# Patient Record
Sex: Male | Born: 1990 | Race: White | Hispanic: No | Marital: Married | Smoking: Never smoker
Health system: Southern US, Community
[De-identification: ages and names within clinical notes are randomized; demographics above are authoritative.]

## PROBLEM LIST (undated history)

## (undated) DIAGNOSIS — K219 Gastro-esophageal reflux disease without esophagitis: Secondary | ICD-10-CM

## (undated) DIAGNOSIS — R569 Unspecified convulsions: Secondary | ICD-10-CM

## (undated) HISTORY — PX: NO PAST SURGERIES: SHX2092

---
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
## 2014-10-07 LAB — BASIC METABOLIC PANEL
BUN: 19 mg/dL (ref 4–21)
Creatinine: 1 mg/dL (ref <=1.3)
GLUCOSE: 95 mg/dL
Potassium: 4.2 mmol/L (ref 3.4–5.3)
SODIUM: 140 mmol/L (ref 137–147)

## 2014-10-07 LAB — HEPATIC FUNCTION PANEL
AST: 65 U/L — AB (ref 14–40)
Alkaline Phosphatase: 84 U/L (ref 25–125)
BILIRUBIN, TOTAL: 0.5 mg/dL

## 2014-10-17 DIAGNOSIS — Z8669 Personal history of other diseases of the nervous system and sense organs: Secondary | ICD-10-CM | POA: Insufficient documentation

## 2014-10-17 DIAGNOSIS — F988 Other specified behavioral and emotional disorders with onset usually occurring in childhood and adolescence: Secondary | ICD-10-CM | POA: Insufficient documentation

## 2014-10-17 DIAGNOSIS — R748 Abnormal levels of other serum enzymes: Secondary | ICD-10-CM | POA: Insufficient documentation

## 2014-12-10 ENCOUNTER — Ambulatory Visit: Payer: Self-pay | Admitting: Family Medicine

## 2015-01-14 ENCOUNTER — Other Ambulatory Visit: Payer: Self-pay | Admitting: Gastroenterology

## 2015-01-14 DIAGNOSIS — R748 Abnormal levels of other serum enzymes: Secondary | ICD-10-CM

## 2015-01-15 ENCOUNTER — Ambulatory Visit
Admission: RE | Admit: 2015-01-15 | Discharge: 2015-01-15 | Disposition: A | Discharge status: Home / Self Care | Payer: BLUE CROSS/BLUE SHIELD | Source: Ambulatory Visit | Attending: Gastroenterology | Admitting: Gastroenterology

## 2015-01-15 DIAGNOSIS — R748 Abnormal levels of other serum enzymes: Secondary | ICD-10-CM | POA: Diagnosis not present

## 2015-01-21 ENCOUNTER — Encounter: Payer: Self-pay | Admitting: Family Medicine

## 2015-01-21 ENCOUNTER — Ambulatory Visit (INDEPENDENT_AMBULATORY_CARE_PROVIDER_SITE_OTHER): Payer: BLUE CROSS/BLUE SHIELD | Admitting: Family Medicine

## 2015-01-21 VITALS — BP 146/72 | HR 60 | Temp 98.1°F | Resp 12 | Wt 163.0 lb

## 2015-01-21 DIAGNOSIS — F988 Other specified behavioral and emotional disorders with onset usually occurring in childhood and adolescence: Secondary | ICD-10-CM

## 2015-01-21 DIAGNOSIS — K219 Gastro-esophageal reflux disease without esophagitis: Secondary | ICD-10-CM | POA: Insufficient documentation

## 2015-01-21 DIAGNOSIS — F909 Attention-deficit hyperactivity disorder, unspecified type: Secondary | ICD-10-CM

## 2015-01-21 MED ORDER — METHYLPHENIDATE HCL ER (OSM) 36 MG PO TBCR: 36.0000 mg | EXTENDED_RELEASE_TABLET | Freq: Two times a day (BID) | ORAL | Status: DC | PRN

## 2015-01-21 NOTE — Progress Notes (Signed)
Patient ID: Clinton Bishop, male   DOB: 19-Mar-1991, 24 y.o.   MRN: [id number]    Subjective:  HPI  ADHD follow up: Patient was last seen in May 2016. He is doing well, symptoms are stable. He only takes Concerta maybe once a week if even that and is doing fine with that regimen.  Migraine headaches follow up: He is doing fine with this. He has Imitrex and may have to take it about once a month.  Elevated liver enzymes follow up: In may his AST and ALT was still up so we sent him to see Dr. Shelle Iron and he saw him, he had more labs drawn and had CT scan done. Dr. Shelle Iron is not sure why his enzymes are still up and recommended to get liver biopsy and patient wanted to disscuss this today.  B/P elevated a little today from his normal and patient states this was an issue when he saw Dr. Shelle Iron also.  Prior to Admission medications   Medication Sig Start Date End Date Taking? Authorizing Provider  methylphenidate 36 MG PO CR tablet Take by mouth. 09/29/14   Historical Provider, MD  SUMAtriptan (IMITREX) 50 MG tablet Take by mouth. 10/08/13   Historical Provider, MD    Patient Active Problem List   Diagnosis Date Noted  . Acid reflux 01/21/2015  . ADD (attention deficit disorder) 10/17/2014  . Abnormal liver enzymes 10/17/2014  . History of migraine headaches 10/17/2014    No past medical history on file.  Social History   Social History  . Marital Status: Single    Spouse Name: N/A  . Number of Children: N/A  . Years of Education: N/A   Occupational History  . Not on file.   Social History Main Topics  . Smoking status: Never Smoker   . Smokeless tobacco: Never Used  . Alcohol Use: No  . Drug Use: No  . Sexual Activity: Not on file   Other Topics Concern  . Not on file   Social History Narrative    No Known Allergies  Review of Systems  Constitutional: Negative.   Respiratory: Negative.   Cardiovascular: Negative.   Gastrointestinal: Negative.   Musculoskeletal:  Negative.   Neurological: Negative.      There is no immunization history on file for this patient. Objective:  BP 146/72 mmHg  Pulse 60  Temp(Src) 98.1 F (36.7 C)  Resp 12  Wt 163 lb (73.936 kg)  Physical Exam  Constitutional: He is well-developed, well-nourished, and in no distress.  HENT:  Head: Normocephalic and atraumatic.  Right Ear: External ear normal.  Left Ear: External ear normal.  Nose: Nose normal.  Eyes: Conjunctivae are normal.  Cardiovascular: Normal rate.   Pulmonary/Chest: Effort normal.  Abdominal: Soft.  Skin: Skin is warm and dry.  Psychiatric: Mood, memory, affect and judgment normal.    No results found for: WBC, HGB, HCT, PLT, GLUCOSE, CHOL, TRIG, HDL, LDLDIRECT, LDLCALC, TSH, PSA, INR, GLUF, HGBA1C, MICROALBUR  CMP     Component Value Date/Time   NA 140 10/07/2014   K 4.2 10/07/2014   BUN 19 10/07/2014   CREATININE 1.0 10/07/2014   AST 65* 10/07/2014   ALKPHOS 84 10/07/2014    Assessment and Plan :  1. ADD (attention deficit disorder) Refill 1 - methylphenidate 36 MG PO CR tablet; Take 1 tablet (36 mg total) by mouth 2 (two) times daily as needed.  Dispense: 60 tablet; Refill: 0 2. Elevated liver enzymes Unknown etiology. Workup by  Dr.Rein was negative. He recommended a liver biopsy or referral to tertiary center. Reviewing everything and long discussion with patient will recommend referral to [facility] or [facility]. Patient is a nondrinker who uses no illicit drugs. GI did not think it was a Concerta as a cause. 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 Watch blood pressure I have done the exam and reviewed the above chart and it is accurate to the best of my knowledge.  Julieanne Manson MD [facility] 01/21/2015 1:59 PM

## 2015-01-22 ENCOUNTER — Other Ambulatory Visit: Payer: Self-pay | Admitting: Orthopedic Surgery

## 2015-01-22 DIAGNOSIS — R748 Abnormal levels of other serum enzymes: Secondary | ICD-10-CM

## 2015-01-29 ENCOUNTER — Other Ambulatory Visit: Payer: Self-pay | Admitting: Radiology

## 2015-02-02 ENCOUNTER — Ambulatory Visit
Admission: RE | Admit: 2015-02-02 | Discharge: 2015-02-02 | Disposition: A | Discharge status: Home / Self Care | Payer: BLUE CROSS/BLUE SHIELD | Source: Ambulatory Visit | Attending: Orthopedic Surgery | Admitting: Orthopedic Surgery

## 2015-02-02 DIAGNOSIS — R748 Abnormal levels of other serum enzymes: Secondary | ICD-10-CM | POA: Diagnosis present

## 2015-02-02 DIAGNOSIS — K219 Gastro-esophageal reflux disease without esophagitis: Secondary | ICD-10-CM | POA: Insufficient documentation

## 2015-02-02 DIAGNOSIS — K760 Fatty (change of) liver, not elsewhere classified: Secondary | ICD-10-CM | POA: Insufficient documentation

## 2015-02-02 HISTORY — DX: Unspecified convulsions: R56.9

## 2015-02-02 HISTORY — DX: Gastro-esophageal reflux disease without esophagitis: K21.9

## 2015-02-02 LAB — APTT: APTT: 30 s (ref 24–36)

## 2015-02-02 LAB — CBC
HCT: 45.4 % (ref 40.0–52.0)
HEMOGLOBIN: 15.6 g/dL (ref 13.0–18.0)
MCH: 30.5 pg (ref 26.0–34.0)
MCHC: 34.3 g/dL (ref 32.0–36.0)
MCV: 88.8 fL (ref 80.0–100.0)
PLATELETS: 311 10*3/uL (ref 150–440)
RBC: 5.11 MIL/uL (ref 4.40–5.90)
RDW: 12.9 % (ref 11.5–14.5)
WBC: 8.4 10*3/uL (ref 3.8–10.6)

## 2015-02-02 LAB — PROTIME-INR
INR: 0.91
PROTHROMBIN TIME: 12.5 s (ref 11.4–15.0)

## 2015-02-02 MED ORDER — SODIUM CHLORIDE 0.9 % IV SOLN
Freq: Once | INTRAVENOUS | Status: AC
  Administered 2015-02-02: 09:00:00 via INTRAVENOUS

## 2015-02-02 MED ORDER — MIDAZOLAM HCL 5 MG/5ML IJ SOLN
INTRAMUSCULAR | Status: AC | PRN
  Administered 2015-02-02 (×2): 1 mg via INTRAVENOUS

## 2015-02-02 MED ORDER — FENTANYL CITRATE (PF) 100 MCG/2ML IJ SOLN
INTRAMUSCULAR | Status: AC | PRN
  Administered 2015-02-02: 50 ug via INTRAVENOUS
  Administered 2015-02-02: 25 ug via INTRAVENOUS

## 2015-02-02 NOTE — Procedures (Signed)
Interventional Radiology Procedure Note  Procedure: US guided medical liver biopsy, right liver lobe.  3 x 18G core biopsy.   Complications: None Recommendations:  - Ok to shower tomorrow - Do not submerge for 7 days - Routine care  - follow up biopsy result - return to work with light duties for 48 hours.   Signed,  Yvone Neu. Loreta Ave, DO

## 2015-02-02 NOTE — H&P (Signed)
Chief Complaint: I'm here for a liver biopsy.   Referring Physician(s): Weingold,Matthew  History of Present Illness: Clinton Bishop is a 24 y.o. male referred for evaluation for a medical liver biopsy.    He has a history of transaminitis, with no cause identified.    Past Medical History  Diagnosis Date  . GERD (gastroesophageal reflux disease)   . Seizures     Past Surgical History  Procedure Laterality Date  . No past surgeries      Allergies: Review of patient's allergies indicates no known allergies.  Medications: Prior to Admission medications   Medication Sig Start Date End Date Taking? Authorizing Provider  ibuprofen (ADVIL,MOTRIN) 100 MG tablet Take 100 mg by mouth every 6 (six) hours as needed for fever.   Yes Historical Provider, MD  methylphenidate 36 MG PO CR tablet Take 1 tablet (36 mg total) by mouth 2 (two) times daily as needed. 01/21/15  Yes Richard Hulen Shouts., MD  SUMAtriptan (IMITREX) 50 MG tablet Take by mouth. 10/08/13  Yes Historical Provider, MD     Family History  Problem Relation Age of Onset  . Anxiety disorder Sister     Social History   Social History  . Marital Status: Single    Spouse Name: N/A  . Number of Children: N/A  . Years of Education: N/A   Social History Main Topics  . Smoking status: Never Smoker   . Smokeless tobacco: Never Used  . Alcohol Use: No  . Drug Use: No  . Sexual Activity: Not Asked   Other Topics Concern  . None   Social History Narrative       Review of Systems: A 12 point ROS discussed and pertinent positives are indicated in the HPI above.  All other systems are negative.  Review of Systems  Vital Signs: BP 128/83 mmHg  Pulse 65  Temp(Src) 98.2 F (36.8 C) (Oral)  Resp 13  Ht  (1.676 m)  Wt 160 lb (72.576 kg)  BMI 25.84 kg/m2  SpO2 99%  Physical Exam  Mallampati Score:  2 Imaging: [place] Art/ven Flow Abd Pelv Doppler  01/15/2015   CLINICAL DATA:  Elevated liver  enzymes  EXAM: DUPLEX ULTRASOUND OF LIVER ; ULTRASOUND RIGHT UPPER QUADRANT  TECHNIQUE: Color and duplex Doppler ultrasound was performed to evaluate the hepatic in-flow and out-flow vessels. Real-time interrogation of the gallbladder, common bile duct, and liver obtained.  COMPARISON:  None.  FINDINGS: Gallbladder: There are no gallstones. No gallbladder wall thickening or pericholecystic fluid. There is no sonographic Murphy sign.  Common bile duct: 2 mm. No intrahepatic or extrahepatic biliary duct dilatation.  Liver: Liver echogenicity is normal. No focal liver lesions are identified morphologically.  Portal Vein Velocities  Main: 40.6 centimeter/second proximal; 27.8 centimeter/second mid ; 28.4 cm per second distally  Right:  21.3 cm/sec  Left:  31.5 cm/sec  Hepatic Vein Velocities  Right:  30.0 cm/sec  Middle:  29.0 cm/sec  Left:  21.3 cm/sec  Hepatic Artery Velocity:  68.4 cm/sec  Splenic Vein Velocity:  21.7 cm/sec  Infrahepatic inferior vena cava:  20.3 centimeter/second.  Varices:  None  Ascites: None  Flow in all vessels is in the anatomic direction. There is no demonstrable portal vein thrombus or occlusion. There is no splenic vein thrombus or occlusion. Spleen is normal in size and contour.  IMPRESSION: There is no lesion referable to the gallbladder, biliary ductal system, or liver. The portal and hepatic veins are patent  with flow in the respective anatomic direction. Peak systolic velocities are within normal limits. No abnormality is identified on this study.   Electronically Signed   By: Bretta Bang III M.D.   On: 01/15/2015 11:03   US Abdomen Limited Ruq  01/15/2015   CLINICAL DATA:  Elevated liver enzymes  EXAM: DUPLEX ULTRASOUND OF LIVER ; ULTRASOUND RIGHT UPPER QUADRANT  TECHNIQUE: Color and duplex Doppler ultrasound was performed to evaluate the hepatic in-flow and out-flow vessels. Real-time interrogation of the gallbladder, common bile duct, and liver obtained.  COMPARISON:  None.   FINDINGS: Gallbladder: There are no gallstones. No gallbladder wall thickening or pericholecystic fluid. There is no sonographic Murphy sign.  Common bile duct: 2 mm. No intrahepatic or extrahepatic biliary duct dilatation.  Liver: Liver echogenicity is normal. No focal liver lesions are identified morphologically.  Portal Vein Velocities  Main: 40.6 centimeter/second proximal; 27.8 centimeter/second mid ; 28.4 cm per second distally  Right:  21.3 cm/sec  Left:  31.5 cm/sec  Hepatic Vein Velocities  Right:  30.0 cm/sec  Middle:  29.0 cm/sec  Left:  21.3 cm/sec  Hepatic Artery Velocity:  68.4 cm/sec  Splenic Vein Velocity:  21.7 cm/sec  Infrahepatic inferior vena cava:  20.3 centimeter/second.  Varices:  None  Ascites: None  Flow in all vessels is in the anatomic direction. There is no demonstrable portal vein thrombus or occlusion. There is no splenic vein thrombus or occlusion. Spleen is normal in size and contour.  IMPRESSION: There is no lesion referable to the gallbladder, biliary ductal system, or liver. The portal and hepatic veins are patent with flow in the respective anatomic direction. Peak systolic velocities are within normal limits. No abnormality is identified on this study.   Electronically Signed   By: Bretta Bang III M.D.   On: 01/15/2015 11:03    Labs:  CBC:  Recent Labs  02/02/15 0835  WBC 8.4  HGB 15.6  HCT 45.4  PLT 311    COAGS:  Recent Labs  02/02/15 0835  INR 0.91  APTT 30    BMP:  Recent Labs  10/07/14  NA 140  K 4.2  BUN 19  CREATININE 1.0    LIVER FUNCTION TESTS:  Recent Labs  10/07/14  AST 65*  ALKPHOS 84    TUMOR MARKERS: No results for input(s): AFPTM, CEA, CA199, CHROMGRNA in the last 8760 hours.  Assessment and Plan:  Mr Clinton Bishop is a 24 year old male with transaminitis, meeting criteria for image guided liver biopsy.  US guided biopsy will be performed.   Thank you for this interesting consult.  I greatly enjoyed meeting  Clinton Bishop and look forward to participating in their care.  A copy of this report was sent to the requesting provider on this date.  SignedGilmer Mor 02/02/2015, 10:15 AM

## 2015-02-03 LAB — SURGICAL PATHOLOGY

## 2015-02-11 ENCOUNTER — Ambulatory Visit: Payer: BLUE CROSS/BLUE SHIELD | Admitting: Family Medicine

## 2015-06-14 ENCOUNTER — Other Ambulatory Visit: Payer: Self-pay

## 2015-11-08 ENCOUNTER — Ambulatory Visit (INDEPENDENT_AMBULATORY_CARE_PROVIDER_SITE_OTHER): Payer: 59 | Admitting: Family Medicine

## 2015-11-08 ENCOUNTER — Other Ambulatory Visit: Payer: Self-pay | Admitting: Family Medicine

## 2015-11-08 VITALS — BP 134/92 | HR 76 | Temp 98.6°F | Resp 12 | Wt 166.0 lb

## 2015-11-08 DIAGNOSIS — Z113 Encounter for screening for infections with a predominantly sexual mode of transmission: Secondary | ICD-10-CM

## 2015-11-08 NOTE — Progress Notes (Signed)
Patient ID: Clinton Bishop, male   DOB: 1990/09/24, 24 y.o.   MRN: [id number]    Subjective:  HPI  Patient would like to get checked for STD. He has not been exposed to STD as far as he knows. He is not having any symptoms. His girlfriend wanted to get tested to make sure they are both in the clear before becoming sexually active. No drug use, no IV drug use, no sex with prostitutes, no homosexual sex, he has had a low number  partners and none with STDs to his knowledge. Prior to Admission medications   Medication Sig Start Date End Date Taking? Authorizing Provider  ibuprofen (ADVIL,MOTRIN) 100 MG tablet Take 100 mg by mouth every 6 (six) hours as needed for fever.   Yes Historical Provider, MD  methylphenidate 36 MG PO CR tablet Take 1 tablet (36 mg total) by mouth 2 (two) times daily as needed. 01/21/15  Yes Richard Hulen Shouts., MD  SUMAtriptan (IMITREX) 50 MG tablet Take by mouth. 10/08/13  Yes Historical Provider, MD    Patient Active Problem List   Diagnosis Date Noted  . Acid reflux 01/21/2015  . ADD (attention deficit disorder) 10/17/2014  . Abnormal liver enzymes 10/17/2014  . History of migraine headaches 10/17/2014    Past Medical History  Diagnosis Date  . GERD (gastroesophageal reflux disease)   . Seizures     Social History   Social History  . Marital Status: Single    Spouse Name: N/A  . Number of Children: N/A  . Years of Education: N/A   Occupational History  . Not on file.   Social History Main Topics  . Smoking status: Never Smoker   . Smokeless tobacco: Never Used  . Alcohol Use: No  . Drug Use: No  . Sexual Activity: Not on file   Other Topics Concern  . Not on file   Social History Narrative    No Known Allergies  Review of Systems  Constitutional: Negative.   Respiratory: Negative.   Cardiovascular: Negative.   Gastrointestinal: Negative.   Genitourinary: Negative.   Musculoskeletal: Negative.      There is no immunization  history on file for this patient. Objective:  BP 134/92 mmHg  Pulse 76  Temp(Src) 98.6 F (37 C)  Resp 12  Wt 166 lb (75.297 kg)  Physical Exam  Constitutional: He is oriented to person, place, and time and well-developed, well-nourished, and in no distress.  HENT:  Head: Normocephalic and atraumatic.  Cardiovascular: Normal rate and regular rhythm.   Pulmonary/Chest: Effort normal and breath sounds normal.  Abdominal: Soft.  Genitourinary: Penis normal. No discharge found.  Neurological: He is alert and oriented to person, place, and time.  Skin: Skin is warm and dry.  Psychiatric: Mood, memory, affect and judgment normal.    Lab Results  Component Value Date   WBC 8.4 02/02/2015   HGB 15.6 02/02/2015   HCT 45.4 02/02/2015   PLT 311 02/02/2015   INR 0.91 02/02/2015    CMP     Component Value Date/Time   NA 140 10/07/2014   K 4.2 10/07/2014   BUN 19 10/07/2014   CREATININE 1.0 10/07/2014   AST 65* 10/07/2014   ALKPHOS 84 10/07/2014    Assessment and Plan :  1. Screening examination for STD (sexually transmitted disease) Patient is asymptomatic, patient just wants to get tested for peace of mind. Pending results. - RPR - Hepatitis c antibody (reflex) - Hepatitis B core antibody,  IgM - HIV antibody (with reflex) - GC/Chlamydia Probe Amp 2. ADD Patient presently able to do his job without medications. I have done the exam and reviewed the above chart and it is accurate to the best of my knowledge.  Patient was seen and examined by Dr. Bosie Closichard L Gilbert and note was scribed by Samara DeistAnastasiya Aleksandrova, RMA.   Julieanne Mansonichard Gilbert MD [facility] 11/08/2015 9:37 AM

## 2015-11-09 LAB — RPR: RPR: NONREACTIVE

## 2015-11-09 LAB — HEPATITIS C ANTIBODY (REFLEX)

## 2015-11-09 LAB — GC/CHLAMYDIA PROBE AMP
Chlamydia trachomatis, NAA: NEGATIVE
Neisseria gonorrhoeae by PCR: NEGATIVE

## 2015-11-09 LAB — HIV ANTIBODY (ROUTINE TESTING W REFLEX): HIV Screen 4th Generation wRfx: NONREACTIVE

## 2015-11-09 LAB — HCV COMMENT:

## 2015-11-09 LAB — HEPATITIS B CORE ANTIBODY, IGM: HEP B C IGM: NEGATIVE

## 2016-05-22 IMAGING — US US ART/VEN ABD/PELV/SCROTUM DOPPLER LTD
1 series · 13 of 25 positions shown · non-contrast
Comparison: None.

CLINICAL DATA: Elevated liver enzymes

EXAM:
DUPLEX ULTRASOUND OF LIVER ; ULTRASOUND RIGHT UPPER QUADRANT
TECHNIQUE: Color and duplex Doppler ultrasound was performed to evaluate the
hepatic in-flow and out-flow vessels. Real-time interrogation of the
gallbladder, common bile duct, and liver obtained.

[Series 1: us art/ven abd/pelv/scrotum doppler ltd · 0.28mm/px · 13 of 35 slices shown]
[im 1/35]
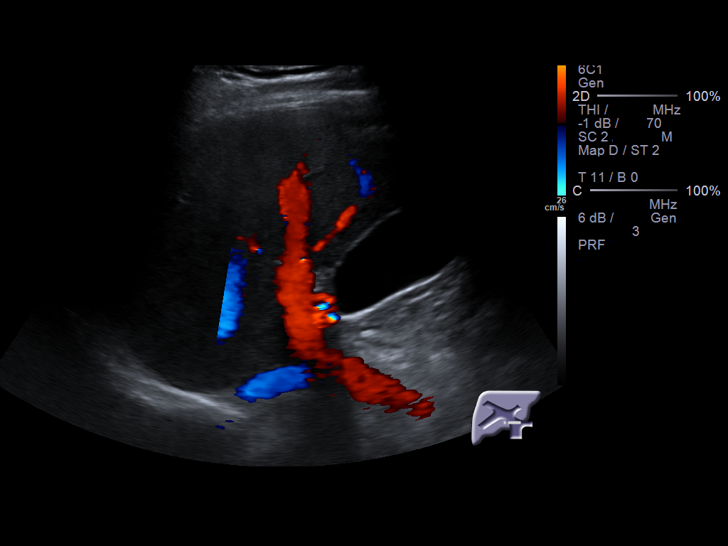
[im 3/35]
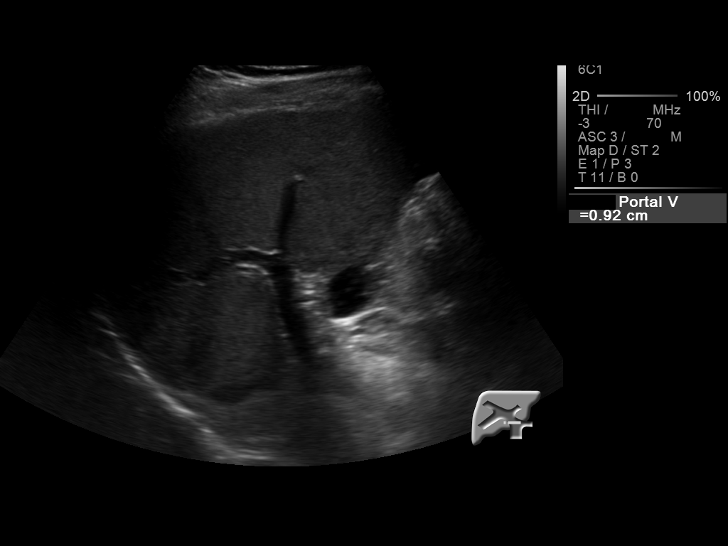
[im 6/35]
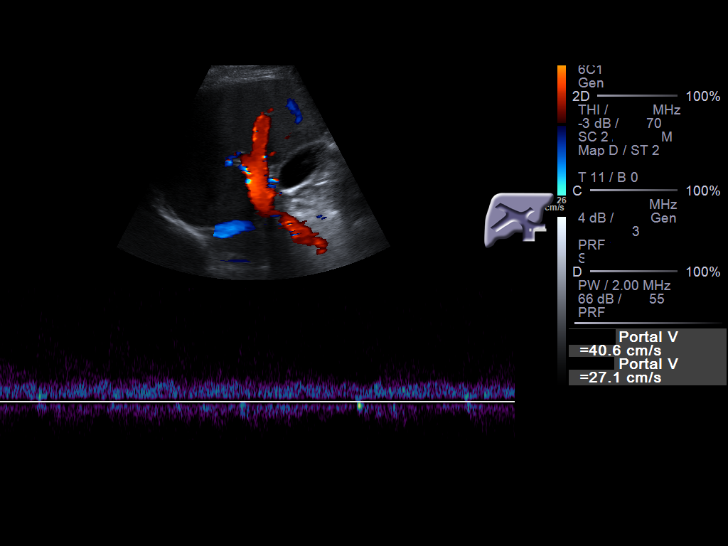
[im 9/35]
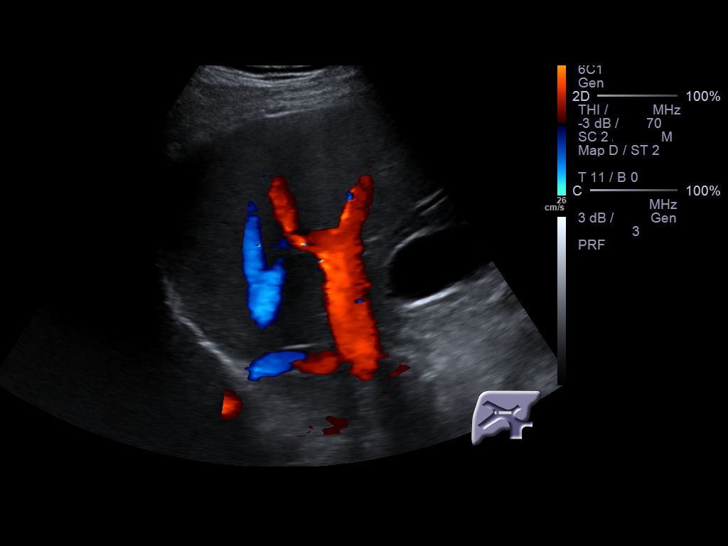
[im 12/35]
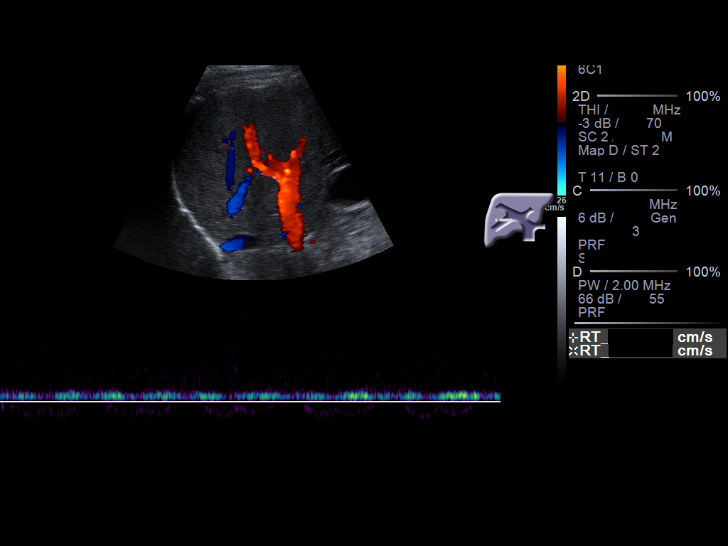
[im 15/35]
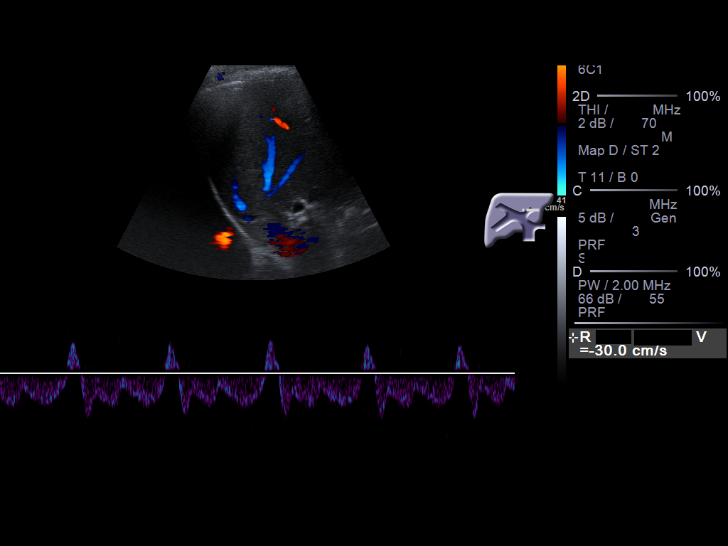
[im 18/35]
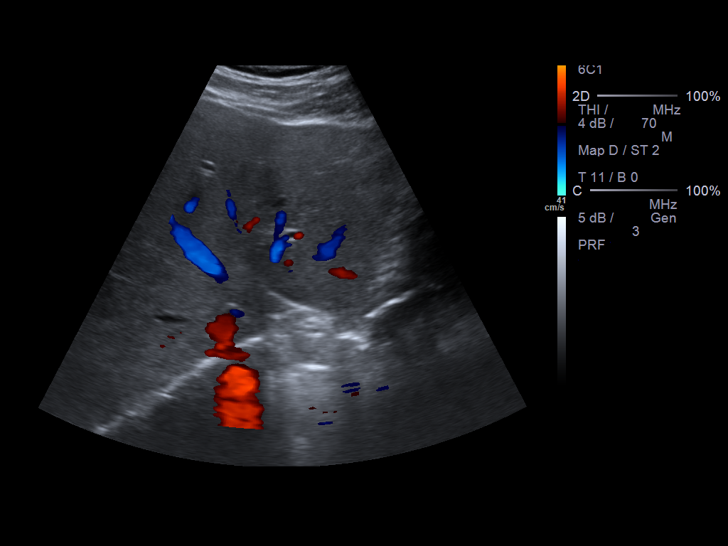
[im 20/35]
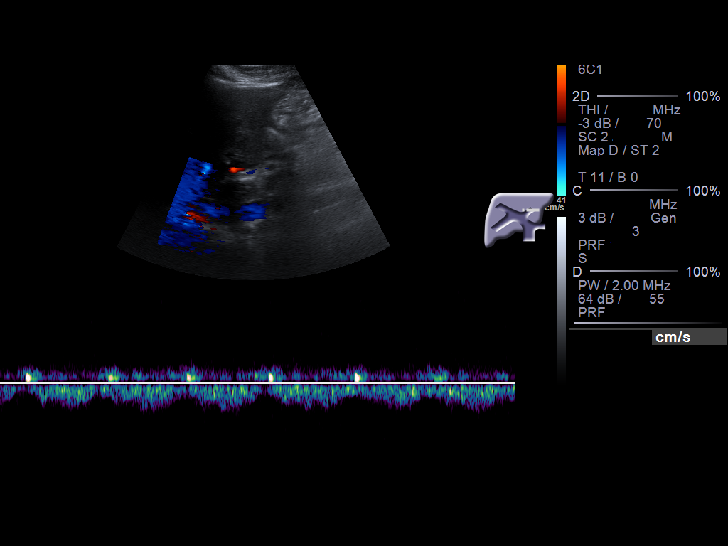
[im 23/35]
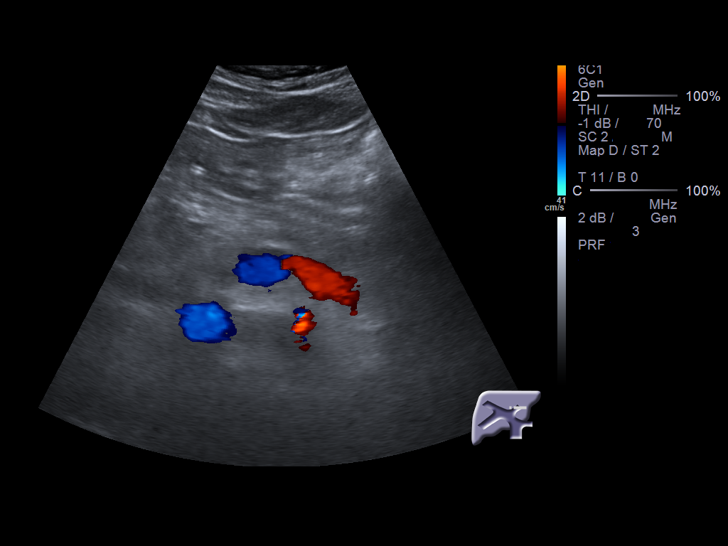
[im 26/35]
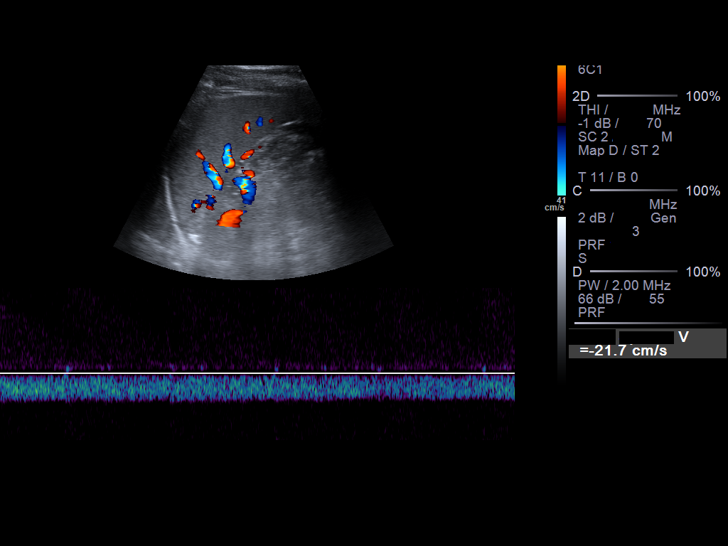
[im 29/35]
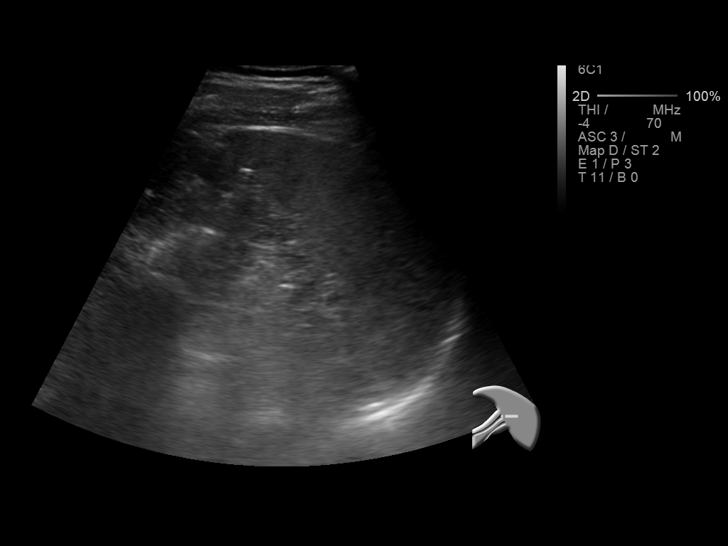
[im 32/35]
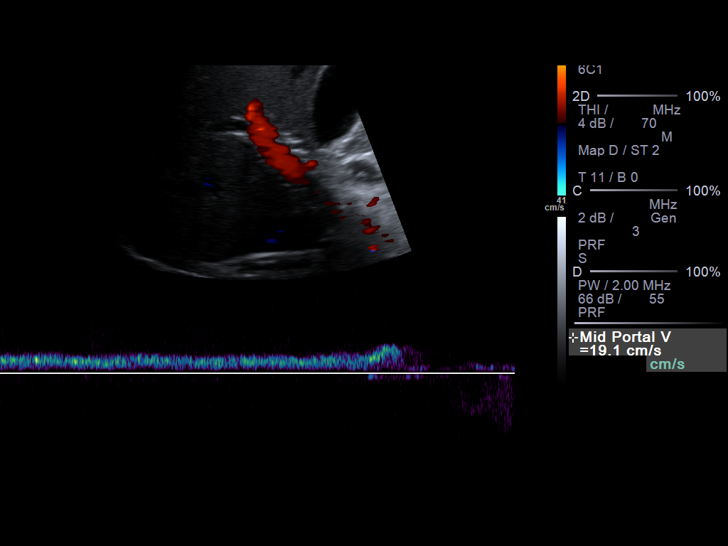
[im 35/35]
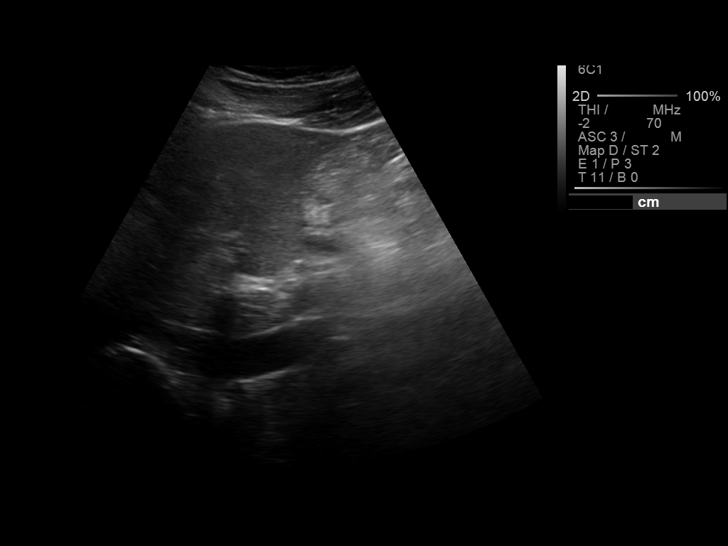

[13 of 25 positions shown; findings below may reference images not displayed]

FINDINGS: Gallbladder: There are no gallstones. No gallbladder wall thickening
or pericholecystic fluid. There is no sonographic Murphy sign.

Common bile duct: 2 mm. No intrahepatic or extrahepatic biliary duct
dilatation.

Liver: Liver echogenicity is normal. No focal liver lesions are
identified morphologically.

Portal Vein Velocities

Main: 40.6 centimeter/second proximal; 27.8 centimeter/second mid ;
28.4 cm per second distally

Right:  21.3 cm/sec

Left:  31.5 cm/sec

Hepatic Vein Velocities

Right:  30.0 cm/sec

Middle:  29.0 cm/sec

Left:  21.3 cm/sec

Hepatic Artery Velocity:  68.4 cm/sec

Splenic Vein Velocity:  21.7 cm/sec

Infrahepatic inferior vena cava:  20.3 centimeter/second.

Varices:  None

Ascites: None

Flow in all vessels is in the anatomic direction. There is no
demonstrable portal vein thrombus or occlusion. There is no splenic
vein thrombus or occlusion. Spleen is normal in size and contour.
IMPRESSION: There is no lesion referable to the gallbladder, biliary ductal
system, or liver. The portal and hepatic veins are patent with flow
in the respective anatomic direction. Peak systolic velocities are
within normal limits. No abnormality is identified on this study.

## 2016-06-09 IMAGING — US US BIOPSY
1 series · 9 of 9 positions shown · non-contrast
Comparison: none

CLINICAL DATA: 24-year-old male with a history of transaminitis.

[Series 1: us biopsy · 0.22mm/px · 9 acquisitions, 9 frames shown]
[im 1/9]
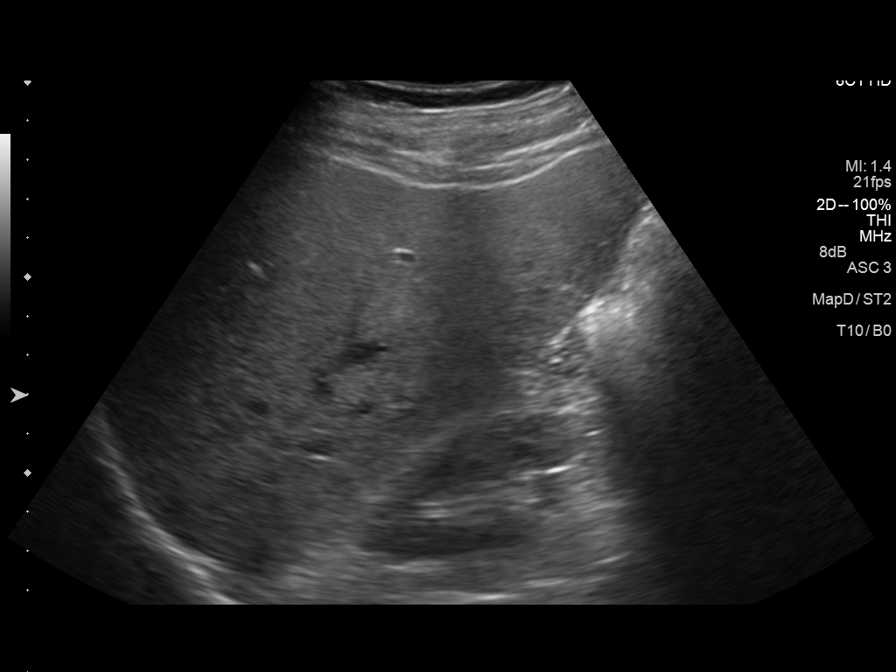
[im 2/9]
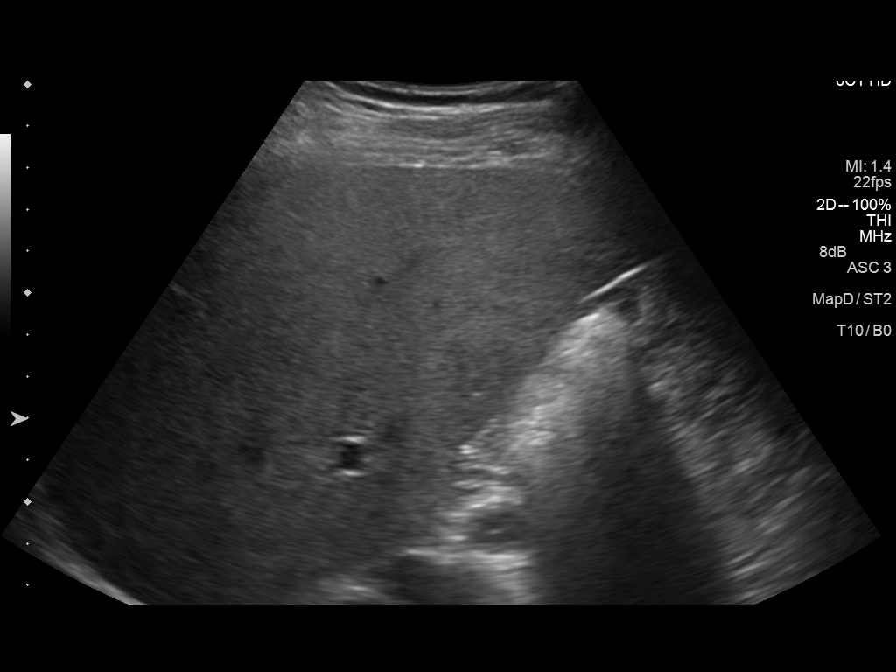
[im 3/9]
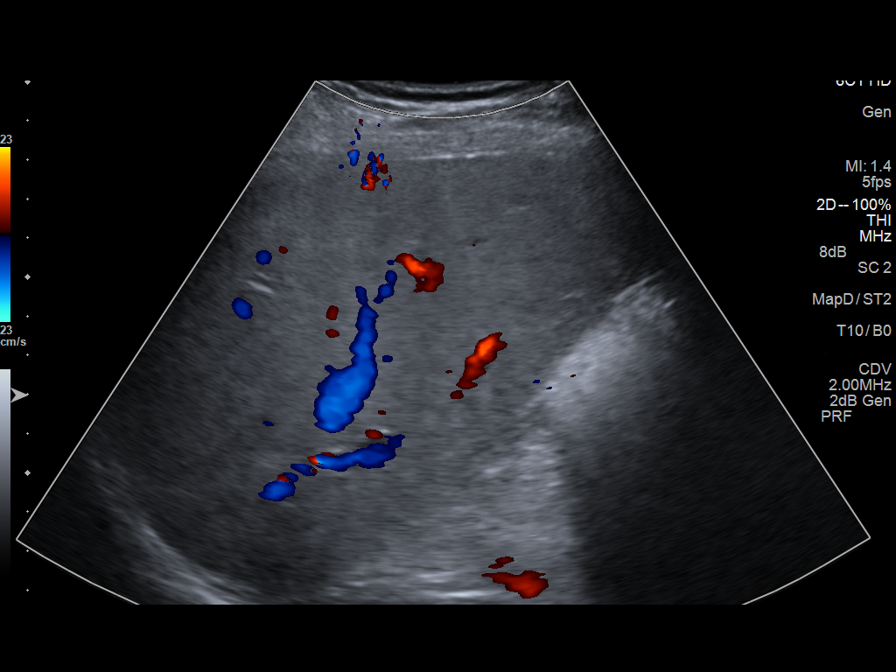
[im 4/9]
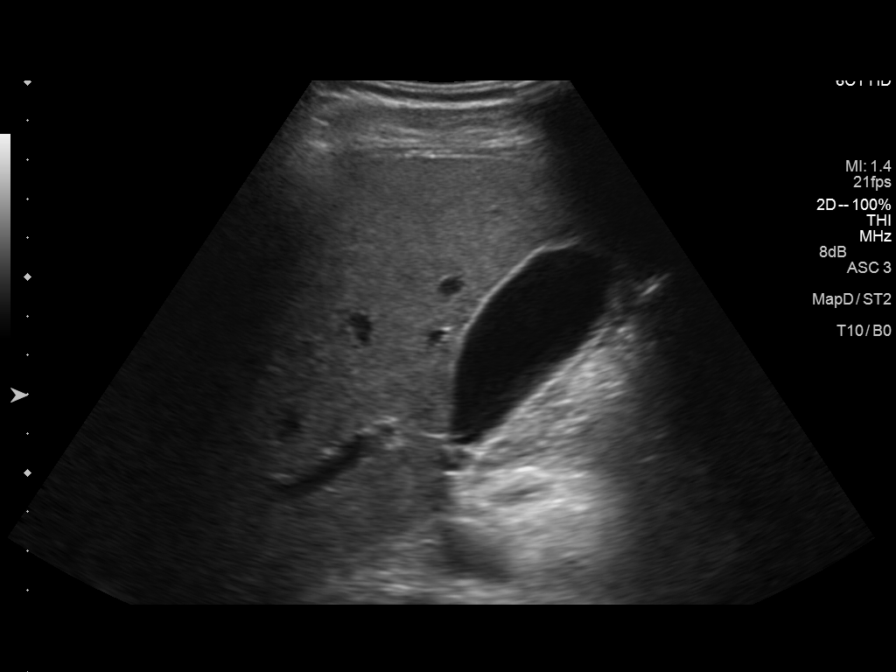
[im 5/9]
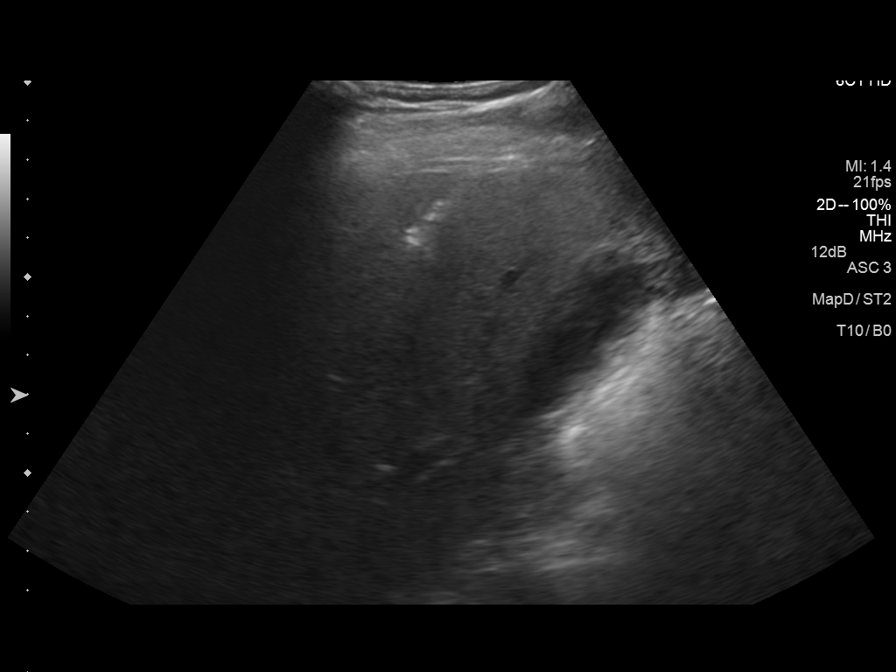
[im 6/9]
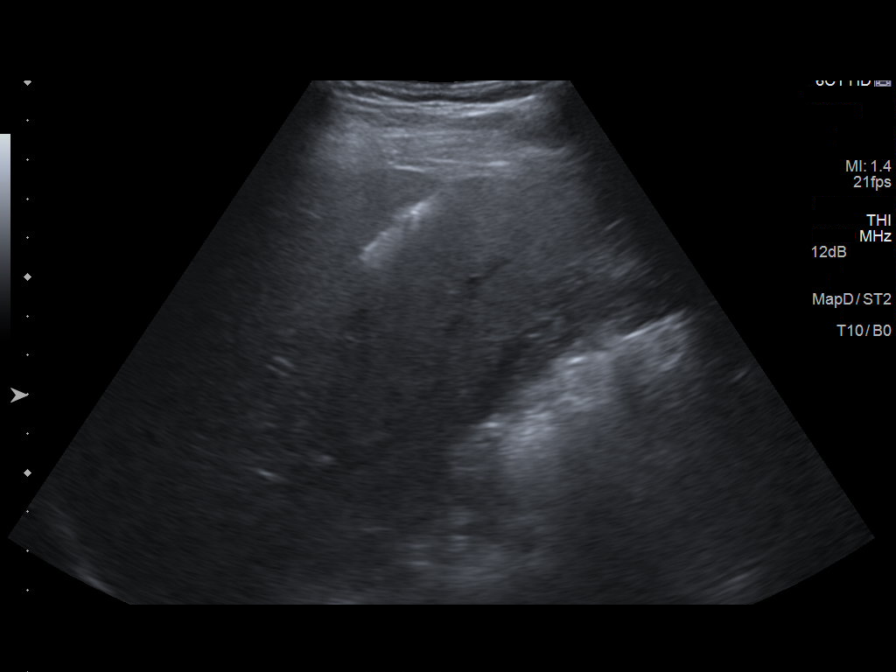
[im 7/9]
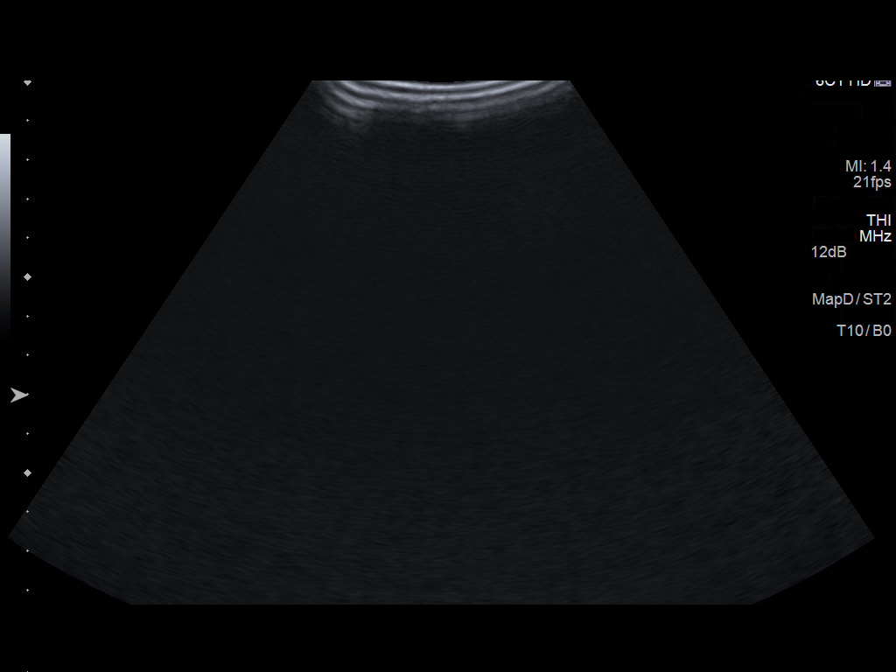
[im 8/9]
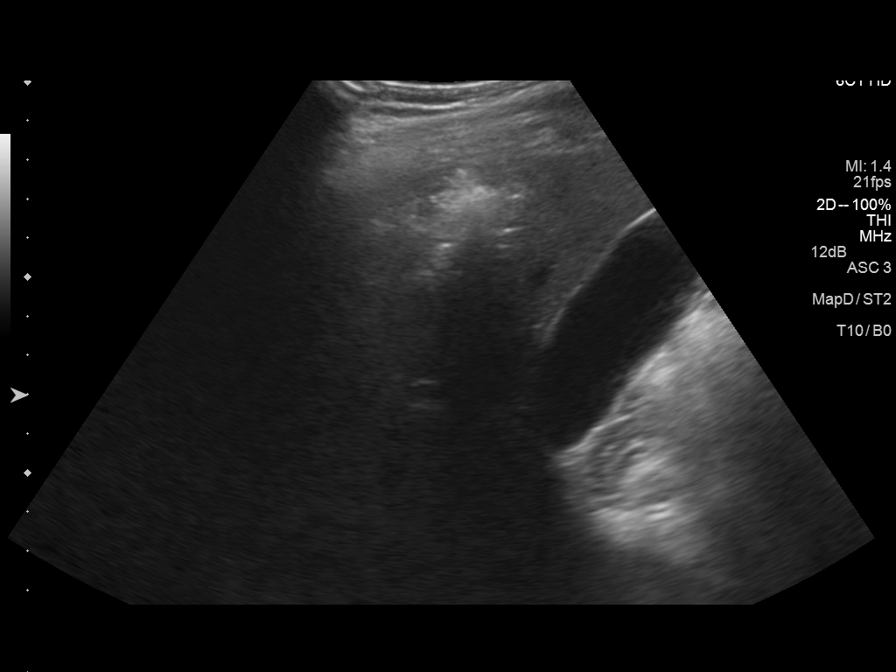
[im 9/9]
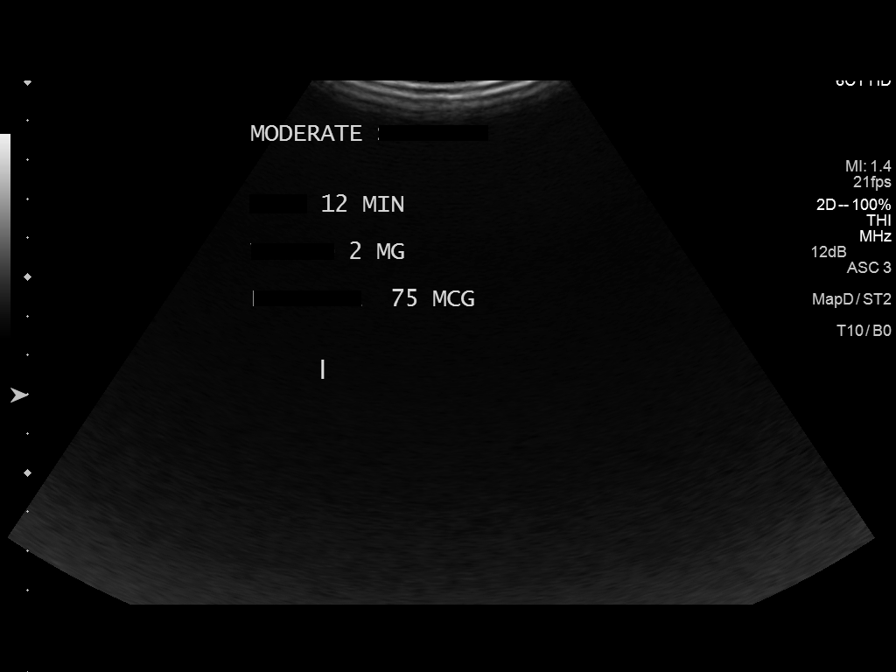

[9 of 9 positions shown; findings below may reference images not displayed]

EXAM:
ULTRASOUND GUIDED CORE BIOPSY OF RIGHT LIVER LOBE

MEDICATIONS:
2.0 mg IV Versed; 75 mcg IV Fentanyl

Total Moderate Sedation Time: 12 minutes

PROCEDURE:
The procedure, risks, benefits, and alternatives were explained to
the patient. Questions regarding the procedure were encouraged and
answered. The patient understands and consents to the procedure.

Ultrasound survey was performed with images stored and sent to PACs.

The right upper abdomen was prepped with chlorhexidine in a sterile
fashion, and a sterile drape was applied covering the operative
field. A sterile gown and sterile gloves were used for the
procedure. Local anesthesia was provided with 1% Lidocaine.

Once the skin and subcutaneous tissues were generously infiltrated
1% lidocaine, a small stab incision was made with 11 blade scalpel.
A 17 gauge guide needle was advanced with ultrasound guidance into
the right liver lobe. The stylet was removed and 3 separate 18 gauge
core biopsy were retrieved.

Three Gel-Foam pledgets were then infused with a small amount of
saline for assistance with hemostasis. The needle was removed.

A final image was stored.

Sterile bandage was placed.

Patient tolerated the procedure well and remained hemodynamically
stable throughout.

No complications were encountered and no significant blood loss was
encounter

COMPLICATIONS:
None.
FINDINGS: Ultrasound survey demonstrates coarsened echotexture of the liver.

Images during the case demonstrate safe needle placement into the
right liver lobe.

Expected gas within the liver parenchyma after infusion of Gel-Foam
pledgets. No complicating features identified.
IMPRESSION: Status post ultrasound-guided medical liver biopsy of the right
liver lobe. Tissue specimen sent to pathology for complete
histopathologic analysis.

## 2017-04-16 ENCOUNTER — Ambulatory Visit: Payer: 59 | Admitting: Family Medicine

## 2017-04-17 ENCOUNTER — Ambulatory Visit (INDEPENDENT_AMBULATORY_CARE_PROVIDER_SITE_OTHER): Payer: 59 | Admitting: Family Medicine

## 2017-04-17 ENCOUNTER — Encounter: Payer: Self-pay | Admitting: Family Medicine

## 2017-04-17 VITALS — BP 142/88 | HR 94 | Temp 98.0°F | Resp 16 | Ht 66.0 in | Wt 183.0 lb

## 2017-04-17 DIAGNOSIS — R748 Abnormal levels of other serum enzymes: Secondary | ICD-10-CM | POA: Diagnosis not present

## 2017-04-17 DIAGNOSIS — E7800 Pure hypercholesterolemia, unspecified: Secondary | ICD-10-CM | POA: Diagnosis not present

## 2017-04-17 DIAGNOSIS — I10 Essential (primary) hypertension: Secondary | ICD-10-CM | POA: Diagnosis not present

## 2017-04-17 DIAGNOSIS — E785 Hyperlipidemia, unspecified: Secondary | ICD-10-CM | POA: Insufficient documentation

## 2017-04-17 DIAGNOSIS — F988 Other specified behavioral and emotional disorders with onset usually occurring in childhood and adolescence: Secondary | ICD-10-CM

## 2017-04-17 LAB — POCT URINALYSIS DIPSTICK
BILIRUBIN UA: NEGATIVE
Blood, UA: NEGATIVE
GLUCOSE UA: NEGATIVE
Ketones, UA: NEGATIVE
LEUKOCYTES UA: NEGATIVE
NITRITE UA: NEGATIVE
PH UA: 6 (ref 5.0–8.0)
Protein, UA: NEGATIVE
Spec Grav, UA: 1.015 (ref 1.010–1.025)
Urobilinogen, UA: 0.2 E.U./dL

## 2017-04-17 MED ORDER — METHYLPHENIDATE HCL ER (OSM) 36 MG PO TBCR: 36.0000 mg | EXTENDED_RELEASE_TABLET | Freq: Two times a day (BID) | ORAL | 0 refills | Status: DC | PRN

## 2017-04-17 MED ORDER — AMLODIPINE BESYLATE 5 MG PO TABS: 5.0000 mg | ORAL_TABLET | Freq: Every day | ORAL | 3 refills | Status: DC

## 2017-04-17 NOTE — Progress Notes (Signed)
yello      Patient: Clinton Bishop Male    DOB: 07-May-1991   26 y.o.   MRN: [id number] Visit Date: 04/17/2017  Today's Provider: Megan Mansichard Addaline Peplinski Jr, Clinton Bishop   Chief Complaint  Patient presents with  . ADD   Subjective:    HPI Pt is here today for a refill on his Concerta. He reports that he only take it as needed. He has not had any in about a year. Pt reports that he needs to be more alert at work and needs to pay attention more. Pt has also been told in the past he has high blood pressure. He wants to know if he needs to be treated for this. Also he was seen in 2016 about his liver enzymes being elevated. He was seen by Dr. Shelle Ironein they did a full liver work up with a biopsy. He was diagnoses with mild fatty liver disease. Pt wants to know if he should have anything further for this.     No Known Allergies   Current Outpatient Medications:  .  methylphenidate 36 MG PO CR tablet, Take 1 tablet (36 mg total) by mouth 2 (two) times daily as needed., Disp: 60 tablet, Rfl: 0 .  SUMAtriptan (IMITREX) 50 MG tablet, Take by mouth., Disp: , Rfl:   Review of Systems  Constitutional: Negative.   HENT: Negative.   Eyes: Negative.   Respiratory: Negative.   Cardiovascular: Negative.   Gastrointestinal: Negative.   Endocrine: Negative.   Genitourinary: Negative.   Musculoskeletal: Negative.   Skin: Negative.   Allergic/Immunologic: Negative.   Neurological: Negative.   Hematological: Negative.   Psychiatric/Behavioral: Positive for decreased concentration.    Social History   Tobacco Use  . Smoking status: Never Smoker  . Smokeless tobacco: Never Used  Substance Use Topics  . Alcohol use: No   Objective:   BP (!) 142/88 (BP Location: Left Arm, Patient Position: Sitting, Cuff Size: Normal)   Pulse 94   Temp 98 F (36.7 C) (Oral)   Resp 16   Ht 5\' 6"  (1.676 m)   Wt 183 lb (83 kg)   BMI 29.54 kg/m  Vitals:   04/17/17 1344  BP: (!) 142/88  Pulse: 94  Resp: 16  Temp: 98  F (36.7 C)  TempSrc: Oral  Weight: 183 lb (83 kg)  Height: 5\' 6"  (1.676 m)     Physical Exam  Constitutional: He is oriented to person, place, and time. He appears well-developed and well-nourished.  Eyes: Conjunctivae and EOM are normal. Pupils are equal, round, and reactive to light.  Neck: Normal range of motion. Neck supple.  Cardiovascular: Normal rate, regular rhythm, normal heart sounds and intact distal pulses.  Pulmonary/Chest: Effort normal and breath sounds normal.  Musculoskeletal: Normal range of motion.  Neurological: He is alert and oriented to person, place, and time. He has normal reflexes.  Skin: Skin is warm and dry.  Psychiatric: He has a normal mood and affect. His behavior is normal. Judgment and thought content normal.        Assessment & Plan:      1. Essential hypertension  - TSH - EKG 12-Lead - CBC with Differential/Platelet - amLODipine (NORVASC) 5 MG tablet; Take 1 tablet (5 mg total) by mouth daily.  Dispense: 30 tablet; Refill: 3 - POCT urinalysis dipstick  2. Attention deficit disorder, unspecified hyperactivity presence   3. Abnormal liver enzymes  - Comprehensive metabolic panel  4. Pure hypercholesterolemia  - Lipid panel  5. ADD (attention deficit disorder)  - methylphenidate 36 MG PO CR tablet; Take 1 tablet (36 mg total) by mouth 2 (two) times daily as needed.  Dispense: 60 tablet; Refill: 0     HPI, Exam, and A&P Transcribed under the direction and in the presence of Clinton Byers L. Wendelyn BreslowGilbert Jr, Clinton Bishop  Electronically Signed: Silvio PateBrittany Bishop, Clinton Bishop   I have done the exam and reviewed the above chart and it is accurate to the best of my knowledge. [employer]ist[employer]agon  technology has been used in this note in any air is in the dictation or transcription are unintentional.  Clinton Mansichard Lively Haberman Jr, Clinton Bishop  [facility]

## 2017-04-18 DIAGNOSIS — E7800 Pure hypercholesterolemia, unspecified: Secondary | ICD-10-CM | POA: Diagnosis not present

## 2017-04-18 DIAGNOSIS — I10 Essential (primary) hypertension: Secondary | ICD-10-CM | POA: Diagnosis not present

## 2017-04-18 DIAGNOSIS — R748 Abnormal levels of other serum enzymes: Secondary | ICD-10-CM | POA: Diagnosis not present

## 2017-04-19 LAB — CBC WITH DIFFERENTIAL/PLATELET
BASOS ABS: 0 10*3/uL (ref 0.0–0.2)
BASOS: 0 %
EOS (ABSOLUTE): 0.4 10*3/uL (ref 0.0–0.4)
Eos: 4 %
HEMOGLOBIN: 16.3 g/dL (ref 13.0–17.7)
Hematocrit: 45.9 % (ref 37.5–51.0)
IMMATURE GRANS (ABS): 0.1 10*3/uL (ref 0.0–0.1)
Immature Granulocytes: 1 %
LYMPHS ABS: 2.9 10*3/uL (ref 0.7–3.1)
Lymphs: 28 %
MCH: 31.8 pg (ref 26.6–33.0)
MCHC: 35.5 g/dL (ref 31.5–35.7)
MCV: 90 fL (ref 79–97)
MONOCYTES: 7 %
Monocytes Absolute: 0.8 10*3/uL (ref 0.1–0.9)
NEUTROS ABS: 6 10*3/uL (ref 1.4–7.0)
Neutrophils: 60 %
Platelets: 338 10*3/uL (ref 150–379)
RBC: 5.12 x10E6/uL (ref 4.14–5.80)
RDW: 13.3 % (ref 12.3–15.4)
WBC: 10.2 10*3/uL (ref 3.4–10.8)

## 2017-04-19 LAB — COMPREHENSIVE METABOLIC PANEL
ALT: 129 IU/L — AB (ref 0–44)
AST: 70 IU/L — AB (ref 0–40)
Albumin/Globulin Ratio: 1.6 (ref 1.2–2.2)
Albumin: 4.5 g/dL (ref 3.5–5.5)
Alkaline Phosphatase: 73 IU/L (ref 39–117)
BUN/Creatinine Ratio: 24 — ABNORMAL HIGH (ref 9–20)
BUN: 20 mg/dL (ref 6–20)
Bilirubin Total: 0.4 mg/dL (ref 0.0–1.2)
CALCIUM: 9.8 mg/dL (ref 8.7–10.2)
CHLORIDE: 101 mmol/L (ref 96–106)
CO2: 24 mmol/L (ref 20–29)
Creatinine, Ser: 0.85 mg/dL (ref 0.76–1.27)
GFR, EST AFRICAN AMERICAN: 140 mL/min/{1.73_m2} (ref >59)
GFR, EST NON AFRICAN AMERICAN: 121 mL/min/{1.73_m2} (ref >59)
GLUCOSE: 108 mg/dL — AB (ref 65–99)
Globulin, Total: 2.8 g/dL (ref 1.5–4.5)
POTASSIUM: 4.9 mmol/L (ref 3.5–5.2)
Sodium: 141 mmol/L (ref 134–144)
TOTAL PROTEIN: 7.3 g/dL (ref 6.0–8.5)

## 2017-04-19 LAB — LIPID PANEL
CHOL/HDL RATIO: 4.6 ratio (ref 0.0–5.0)
CHOLESTEROL TOTAL: 189 mg/dL (ref 100–199)
HDL: 41 mg/dL (ref >39)
LDL CALC: 110 mg/dL — AB (ref 0–99)
TRIGLYCERIDES: 189 mg/dL — AB (ref 0–149)
VLDL Cholesterol Cal: 38 mg/dL (ref 5–40)

## 2017-04-19 LAB — TSH: TSH: 3.6 u[IU]/mL (ref 0.450–4.500)

## 2017-04-24 ENCOUNTER — Telehealth: Payer: Self-pay | Admitting: Family Medicine

## 2017-04-24 NOTE — Telephone Encounter (Signed)
Spoke with patient. Waiting on pharmacy to call me back. PA was approved yesterday 04/23/17 through 04/23/18-Anastasiya Ander PurpuraV Hopkins, RMA

## 2017-04-24 NOTE — Telephone Encounter (Signed)
Pt is requesting a call back about the Prior Auth for the Rx methylphenidate 36 MG PO CR tablet  CB#[phone]/MW

## 2017-04-25 NOTE — Telephone Encounter (Signed)
[facility] out that [facility] [place] and the one this PA was approved [facility] [place] does not have in their system, do not carry that manufacturer. Called [facility] back and re did PA for the [facility]- NDC number is [id number]. PA approved and Zack at the pharmacy advised and patient advised. Reference number for this case is [id number] Jelinek Ander PurpuraV Leiya Keesey, RMA

## 2017-06-18 ENCOUNTER — Ambulatory Visit: Payer: Self-pay | Admitting: Family Medicine

## 2017-06-18 ENCOUNTER — Ambulatory Visit (INDEPENDENT_AMBULATORY_CARE_PROVIDER_SITE_OTHER): Payer: Managed Care, Other (non HMO) | Admitting: Family Medicine

## 2017-06-18 ENCOUNTER — Other Ambulatory Visit: Payer: Self-pay

## 2017-06-18 VITALS — BP 138/92 | HR 66 | Temp 98.1°F | Resp 16 | Wt 188.0 lb

## 2017-06-18 DIAGNOSIS — R748 Abnormal levels of other serum enzymes: Secondary | ICD-10-CM

## 2017-06-18 DIAGNOSIS — R1013 Epigastric pain: Secondary | ICD-10-CM | POA: Diagnosis not present

## 2017-06-18 DIAGNOSIS — E7800 Pure hypercholesterolemia, unspecified: Secondary | ICD-10-CM

## 2017-06-18 NOTE — Progress Notes (Signed)
Clinton BoysChristopher W Bishop  MRN: [id number] DOB: 09/21/1990  Subjective:  HPI   The patient is a 27 year old male who presents for follow up of chronic disease.  He was last seen on 04/17/17.  Hypertension-The patient was started on Amlodipine on his last visit.  He reports no adverse or allergic reaction to the medication.  He does not routinely check his blood pressure outside of our office.   Patient Active Problem List   Diagnosis Date Noted  . Hyperlipidemia 04/17/2017  . Acid reflux 01/21/2015  . ADD (attention deficit disorder) 10/17/2014  . Abnormal liver enzymes 10/17/2014  . History of migraine headaches 10/17/2014    Past Medical History:  Diagnosis Date  . GERD (gastroesophageal reflux disease)   . Seizures (HCC)     Social History   Socioeconomic History  . Marital status: Single    Spouse name: Not on file  . Number of children: Not on file  . Years of education: Not on file  . Highest education level: Not on file  Social Needs  . Financial resource strain: Not on file  . Food insecurity - worry: Not on file  . Food insecurity - inability: Not on file  . Transportation needs - medical: Not on file  . Transportation needs - non-medical: Not on file  Occupational History  . Not on file  Tobacco Use  . Smoking status: Never Smoker  . Smokeless tobacco: Never Used  Substance and Sexual Activity  . Alcohol use: No  . Drug use: No  . Sexual activity: Not on file  Other Topics Concern  . Not on file  Social History Narrative  . Not on file    Outpatient Encounter Medications as of 06/18/2017  Medication Sig Note  . amLODipine (NORVASC) 5 MG tablet Take 1 tablet (5 mg total) by mouth daily.   . methylphenidate 36 MG PO CR tablet Take 1 tablet (36 mg total) by mouth 2 (two) times daily as needed.   . SUMAtriptan (IMITREX) 50 MG tablet Take by mouth. 10/17/2014: [facility]   No facility-administered encounter medications on  file as of 06/18/2017.     No Known Allergies  Review of Systems  Constitutional: Negative for fever and malaise/fatigue.  Eyes: Negative.   Respiratory: Negative for cough, shortness of breath and wheezing.   Cardiovascular: Positive for chest pain (sternal area). Negative for palpitations, orthopnea, claudication and leg swelling.  Gastrointestinal: Negative for abdominal pain, constipation, diarrhea, heartburn, nausea and vomiting.  Skin: Negative.   Neurological: Negative for dizziness, weakness and headaches.  Endo/Heme/Allergies: Negative.   Psychiatric/Behavioral: Negative.     Objective:  BP (!) 138/92 (BP Location: Right Arm, Patient Position: Sitting, Cuff Size: Normal)   Pulse 66   Temp 98.1 F (36.7 C) (Oral)   Resp 16   Wt 188 lb (85.3 kg)   BMI 30.34 kg/m   Physical Exam  Constitutional: He is oriented to person, place, and time and well-developed, well-nourished, and in no distress.  HENT:  Head: Normocephalic and atraumatic.  Right Ear: External ear normal.  Left Ear: External ear normal.  Nose: Nose normal.  Eyes: Conjunctivae are normal. Pupils are equal, round, and reactive to light. No scleral icterus.  Neck: Normal range of motion. No thyromegaly present.  Cardiovascular: Normal rate, regular rhythm and normal heart sounds.  Pulmonary/Chest: Effort normal and breath sounds normal.  Abdominal: Soft.  Neurological: He is alert and oriented to person, place, and time.  Gait normal. GCS score is 15.  Skin: Skin is warm and dry.  Psychiatric: Mood, memory, affect and judgment normal.    Assessment and Plan :  1. Epigastric pain  - Comprehensive metabolic panel - HepB+HepC+HIV Panel - Fe+TIBC+Fer  2. Elevated liver enzymes  - Comprehensive metabolic panel - HepB+HepC+HIV Panel - Fe+TIBC+Fer  3. Hypercholesterolemia  - Comprehensive metabolic panel - Lipid Panel With LDL/HDL Ratio  4.ADD  I have done the exam and reviewed the chart and it is  accurate to the best of my knowledge. Dentist has been used and  any errors in dictation or transcription are unintentional. Julieanne Manson M.D. [facility]

## 2017-07-12 ENCOUNTER — Telehealth: Payer: Self-pay

## 2017-07-12 NOTE — Telephone Encounter (Signed)
Left message for patient to call back.  Need to change appointment on 12/10/17 due to new template.

## 2017-07-12 NOTE — Telephone Encounter (Signed)
Pt returned call. Apt has been changed. Thanks TNP

## 2017-08-07 ENCOUNTER — Encounter: Payer: Self-pay | Admitting: Family Medicine

## 2017-08-07 ENCOUNTER — Ambulatory Visit (INDEPENDENT_AMBULATORY_CARE_PROVIDER_SITE_OTHER): Payer: Managed Care, Other (non HMO) | Admitting: Family Medicine

## 2017-08-07 VITALS — BP 144/80 | Temp 100.8°F | Resp 16

## 2017-08-07 DIAGNOSIS — Z833 Family history of diabetes mellitus: Secondary | ICD-10-CM | POA: Diagnosis not present

## 2017-08-07 DIAGNOSIS — R05 Cough: Secondary | ICD-10-CM | POA: Diagnosis not present

## 2017-08-07 DIAGNOSIS — J101 Influenza due to other identified influenza virus with other respiratory manifestations: Secondary | ICD-10-CM

## 2017-08-07 DIAGNOSIS — R059 Cough, unspecified: Secondary | ICD-10-CM

## 2017-08-07 LAB — POCT INFLUENZA A/B
INFLUENZA B, POC: NEGATIVE
Influenza A, POC: POSITIVE — AB

## 2017-08-07 MED ORDER — HYDROCODONE-HOMATROPINE 5-1.5 MG/5ML PO SYRP: 5.0000 mL | ORAL_SOLUTION | Freq: Three times a day (TID) | ORAL | 0 refills | Status: DC | PRN

## 2017-08-07 MED ORDER — OSELTAMIVIR PHOSPHATE 75 MG PO CAPS: 75.0000 mg | ORAL_CAPSULE | Freq: Two times a day (BID) | ORAL | 0 refills | Status: DC

## 2017-08-07 NOTE — Progress Notes (Signed)
Patient: Clinton Bishop Male    DOB: Nov 21, 1990   26 y.o.   MRN: [id number] Visit Date: 08/07/2017  Today's Provider: Dortha Kernennis Chrismon, PA   Chief Complaint  Patient presents with  . URI   Subjective:    URI   This is a new problem. The current episode started in the past 7 days (3 days). The maximum temperature recorded prior to his arrival was 101 - 101.9 F. Associated symptoms include abdominal pain, chest pain, congestion, coughing, headaches, joint pain, a sore throat and wheezing. He has tried antihistamine, decongestant and increased fluids for the symptoms.   Past Medical History:  Diagnosis Date  . GERD (gastroesophageal reflux disease)   . Seizures (HCC)    Past Surgical History:  Procedure Laterality Date  . NO PAST SURGERIES     Family History  Problem Relation Age of Onset  . Anxiety disorder Sister    No Known Allergies  Current Outpatient Medications:  .  amLODipine (NORVASC) 5 MG tablet, Take 1 tablet (5 mg total) by mouth daily., Disp: 30 tablet, Rfl: 3 .  methylphenidate 36 MG PO CR tablet, Take 1 tablet (36 mg total) by mouth 2 (two) times daily as needed., Disp: 60 tablet, Rfl: 0 .  SUMAtriptan (IMITREX) 50 MG tablet, Take by mouth., Disp: , Rfl:   Review of Systems  HENT: Positive for congestion and sore throat.   Respiratory: Positive for cough and wheezing.   Cardiovascular: Positive for chest pain.  Gastrointestinal: Positive for abdominal pain.  Musculoskeletal: Positive for joint pain.  Neurological: Positive for headaches.   Social History   Tobacco Use  . Smoking status: Never Smoker  . Smokeless tobacco: Never Used  Substance Use Topics  . Alcohol use: No   Objective:   BP (!) 144/80   Temp (!) 100.8 F (38.2 C)   Resp 16  Vitals:   08/07/17 0928  BP: (!) 144/80  Resp: 16  Temp: (!) 100.8 F (38.2 C)   Physical Exam  Constitutional: He is oriented to person, place, and time. He appears well-developed and  well-nourished. No distress.  HENT:  Head: Normocephalic and atraumatic.  Right Ear: Hearing and external ear normal.  Left Ear: Hearing and external ear normal.  Nose: Nose normal.  Slightly red posterior pharynx. No exudates.  Eyes: Conjunctivae and lids are normal. Right eye exhibits no discharge. Left eye exhibits no discharge. No scleral icterus.  Neck: Neck supple.  Cardiovascular: Normal rate.  Pulmonary/Chest: Effort normal and breath sounds normal. No respiratory distress.  Abdominal: Soft. Bowel sounds are normal.  RUQ discomfort.  Musculoskeletal: Normal range of motion.  Lymphadenopathy:    He has no cervical adenopathy.  Neurological: He is alert and oriented to person, place, and time.  Skin: Skin is intact. No lesion and no rash noted.  Psychiatric: He has a normal mood and affect. His speech is normal and behavior is normal. Thought content normal.      Assessment & Plan:     1. Cough Onset with fever and body aches over the past 48 hours. No significant sputum production and minimal sore throat. Flu test positive for influenza A. Given Tamiflu and Hycodan.  - POCT Influenza A/B - CBC with Differential/Platelet - HYDROcodone-homatropine (HYCODAN) 5-1.5 MG/5ML syrup; Take 5 mLs by mouth every 8 (eight) hours as needed for cough.  Dispense: 120 mL; Refill: 0  2. Influenza A Temperature up to 100.8 this morning after taking Advil. (  Pulse 102). May use antipyretic prn, increase fluid intake and home to rest. Out of work for 4 days or until free of fever for 24 hours without taking Tylenol or Advil. Check CBC and follow up prn. - CBC with Differential/Platelet - oseltamivir (TAMIFLU) 75 MG capsule; Take 1 capsule (75 mg total) by mouth 2 (two) times daily.  Dispense: 10 capsule; Refill: 0  3. Family history of diabetes mellitus (DM) Father and paternal grandfather are diabetics. Patient has had some fatty liver disease. No polyuria, polydipsia or vision changes. Will  check Hgb A1C and CMP. - Hemoglobin A1c       Dortha Kern, PA  [facility]

## 2017-08-07 NOTE — Patient Instructions (Signed)

## 2017-12-10 ENCOUNTER — Ambulatory Visit: Payer: Self-pay | Admitting: Family Medicine

## 2019-01-13 ENCOUNTER — Other Ambulatory Visit: Payer: Self-pay

## 2019-01-13 ENCOUNTER — Ambulatory Visit (INDEPENDENT_AMBULATORY_CARE_PROVIDER_SITE_OTHER): Payer: Self-pay | Admitting: Family Medicine

## 2019-01-13 ENCOUNTER — Encounter: Payer: Self-pay | Admitting: Family Medicine

## 2019-01-13 VITALS — BP 148/86 | HR 87 | Temp 98.3°F | Resp 16 | Ht 65.5 in | Wt 190.2 lb

## 2019-01-13 DIAGNOSIS — R945 Abnormal results of liver function studies: Secondary | ICD-10-CM

## 2019-01-13 DIAGNOSIS — Z23 Encounter for immunization: Secondary | ICD-10-CM

## 2019-01-13 DIAGNOSIS — F988 Other specified behavioral and emotional disorders with onset usually occurring in childhood and adolescence: Secondary | ICD-10-CM

## 2019-01-13 DIAGNOSIS — I10 Essential (primary) hypertension: Secondary | ICD-10-CM | POA: Diagnosis not present

## 2019-01-13 DIAGNOSIS — R7989 Other specified abnormal findings of blood chemistry: Secondary | ICD-10-CM

## 2019-01-13 MED ORDER — AMLODIPINE BESYLATE 5 MG PO TABS: 5.0000 mg | ORAL_TABLET | Freq: Every day | ORAL | 6 refills | Status: DC

## 2019-01-13 MED ORDER — METHYLPHENIDATE HCL ER (OSM) 36 MG PO TBCR: 36.0000 mg | EXTENDED_RELEASE_TABLET | Freq: Two times a day (BID) | ORAL | 0 refills | Status: DC | PRN

## 2019-01-13 NOTE — Progress Notes (Signed)
Patient: Clinton Bishop Male    DOB: [birth year]   28 y.o.   MRN: [id number] Visit Date: 01/13/2019  Today's Provider: Megan Mansichard Reola Buckles Jr, MD   Chief Complaint  Patient presents with  . Hypertension  . needs refills on ADD meds   Subjective:   HPI  Hypertension, follow-up:  BP Readings from Last 3 Encounters:  01/13/19 (!) 148/86  08/07/17 (!) 144/80  06/18/17 (!) 138/92    He was last seen for hypertension 2 years ago.  BP at that visit was 142/88. Management since that visit includes no changes. He reports fair compliance with treatment. He is not having side effects.  He is not exercising. He is adherent to low salt diet.   Outside blood pressures are not being checked. He is experiencing none.  Patient denies exertional chest pressure/discomfort, lower extremity edema and palpitations.    Weight trend: stable Wt Readings from Last 3 Encounters:  01/13/19 190 lb 3.2 oz (86.3 kg)  06/18/17 188 lb (85.3 kg)  04/17/17 183 lb (83 kg)    Current diet: well balanced  Patient also needs a refill on his Concerta.  He is going back to [facility] [employer]. He needs his vaccine record for [employer]. No Known Allergies   Current Outpatient Medications:  .  amLODipine (NORVASC) 5 MG tablet, Take 1 tablet (5 mg total) by mouth daily., Disp: 30 tablet, Rfl: 3 .  methylphenidate 36 MG PO CR tablet, Take 1 tablet (36 mg total) by mouth 2 (two) times daily as needed., Disp: 60 tablet, Rfl: 0 .  SUMAtriptan (IMITREX) 50 MG tablet, Take by mouth., Disp: , Rfl:  .  HYDROcodone-homatropine (HYCODAN) 5-1.5 MG/5ML syrup, Take 5 mLs by mouth every 8 (eight) hours as needed for cough. (Patient not taking: Reported on 01/13/2019), Disp: 120 mL, Rfl: 0 .  oseltamivir (TAMIFLU) 75 MG capsule, Take 1 capsule (75 mg total) by mouth 2 (two) times daily., Disp: 10 capsule, Rfl: 0  Review of Systems  Constitutional: Negative for activity change and fatigue.  Respiratory: Negative for  cough and shortness of breath.   Cardiovascular: Negative for chest pain, palpitations and leg swelling.  Musculoskeletal: Negative for arthralgias.  Neurological: Negative for dizziness and headaches.  Psychiatric/Behavioral: Positive for decreased concentration. Negative for agitation.    Social History   Tobacco Use  . Smoking status: Never Smoker  . Smokeless tobacco: Never Used  Substance Use Topics  . Alcohol use: No      Objective:   BP (!) 148/86   Pulse 87   Temp 98.3 F (36.8 C)   Resp 16   Ht 5' 5.5" (1.664 m)   Wt 190 lb 3.2 oz (86.3 kg)   BMI 31.17 kg/m  Vitals:   01/13/19 1037  BP: (!) 148/86  Pulse: 87  Resp: 16  Temp: 98.3 F (36.8 C)  Weight: 190 lb 3.2 oz (86.3 kg)  Height: 5' 5.5" (1.664 m)     Physical Exam Vitals signs reviewed.  Constitutional:      Appearance: He is well-developed.  Eyes:     Conjunctiva/sclera: Conjunctivae normal.     Pupils: Pupils are equal, round, and reactive to light.  Neck:     Musculoskeletal: Normal range of motion and neck supple.  Cardiovascular:     Rate and Rhythm: Normal rate and regular rhythm.     Heart sounds: Normal heart sounds.  Pulmonary:     Effort: Pulmonary effort is normal.  Breath sounds: Normal breath sounds.  Abdominal:     Palpations: Abdomen is soft.  Musculoskeletal: Normal range of motion.  Skin:    General: Skin is warm and dry.  Neurological:     General: No focal deficit present.     Mental Status: He is alert and oriented to person, place, and time.     Deep Tendon Reflexes: Reflexes are normal and symmetric.  Psychiatric:        Mood and Affect: Mood normal.        Behavior: Behavior normal.        Thought Content: Thought content normal.        Judgment: Judgment normal.      No results found for any visits on 01/13/19.     Assessment & Plan    1. Essential hypertension Discusse lifestyle,might need to add ARB.Will give him 3-4 months to work on this. -  amLODipine (NORVASC) 5 MG tablet; Take 1 tablet (5 mg total) by mouth daily.  Dispense: 30 tablet; Refill: 6 - Comprehensive metabolic panel  2. ADD (attention deficit disorder) RF times 3. - methylphenidate 36 MG PO CR tablet; Take 1 tablet (36 mg total) by mouth 2 (two) times daily as needed.  Dispense: 60 tablet; Refill: 0  3. Abnormal LFTs Fatty liver likely. May need workup. - Comprehensive metabolic panel  4. Need for vaccine for Td (tetanus-diphtheria)  - Td : Tetanus/diphtheria >7yo Preservative  free     Wilhemena Durie, MD  [facility]

## 2019-01-14 LAB — COMPREHENSIVE METABOLIC PANEL
ALT: 133 IU/L — ABNORMAL HIGH (ref 0–44)
AST: 79 IU/L — ABNORMAL HIGH (ref 0–40)
Albumin/Globulin Ratio: 1.6 (ref 1.2–2.2)
Albumin: 4.4 g/dL (ref 4.1–5.2)
Alkaline Phosphatase: 78 IU/L (ref 39–117)
BUN/Creatinine Ratio: 19 (ref 9–20)
BUN: 16 mg/dL (ref 6–20)
Bilirubin Total: 0.5 mg/dL (ref 0.0–1.2)
CO2: 23 mmol/L (ref 20–29)
Calcium: 9.3 mg/dL (ref 8.7–10.2)
Chloride: 100 mmol/L (ref 96–106)
Creatinine, Ser: 0.85 mg/dL (ref 0.76–1.27)
GFR calc Af Amer: 138 mL/min/{1.73_m2} (ref >59)
GFR calc non Af Amer: 119 mL/min/{1.73_m2} (ref >59)
Globulin, Total: 2.7 g/dL (ref 1.5–4.5)
Glucose: 114 mg/dL — ABNORMAL HIGH (ref 65–99)
Potassium: 4.1 mmol/L (ref 3.5–5.2)
Sodium: 138 mmol/L (ref 134–144)
Total Protein: 7.1 g/dL (ref 6.0–8.5)

## 2019-01-16 ENCOUNTER — Telehealth: Payer: Self-pay

## 2019-01-16 NOTE — Telephone Encounter (Signed)
Patient advised as below.  

## 2019-01-16 NOTE — Telephone Encounter (Signed)
-----   Message from Jerrol Banana., MD sent at 01/16/2019 10:59 AM EDT ----- Liver mildly inflamed--stop all alcohol and tylenol and f/u 2 months to recheck.

## 2019-03-17 ENCOUNTER — Other Ambulatory Visit: Payer: Self-pay

## 2019-03-17 ENCOUNTER — Ambulatory Visit (INDEPENDENT_AMBULATORY_CARE_PROVIDER_SITE_OTHER): Payer: BC Managed Care – PPO | Admitting: Family Medicine

## 2019-03-17 ENCOUNTER — Encounter: Payer: Self-pay | Admitting: Family Medicine

## 2019-03-17 VITALS — BP 141/84 | HR 80 | Temp 97.5°F | Resp 16 | Ht 66.0 in | Wt 195.0 lb

## 2019-03-17 DIAGNOSIS — F988 Other specified behavioral and emotional disorders with onset usually occurring in childhood and adolescence: Secondary | ICD-10-CM

## 2019-03-17 DIAGNOSIS — I10 Essential (primary) hypertension: Secondary | ICD-10-CM | POA: Diagnosis not present

## 2019-03-17 DIAGNOSIS — Z23 Encounter for immunization: Secondary | ICD-10-CM

## 2019-03-17 DIAGNOSIS — R748 Abnormal levels of other serum enzymes: Secondary | ICD-10-CM

## 2019-03-17 MED ORDER — METHYLPHENIDATE HCL ER (OSM) 36 MG PO TBCR: 36.0000 mg | EXTENDED_RELEASE_TABLET | Freq: Two times a day (BID) | ORAL | 0 refills | Status: DC | PRN

## 2019-03-17 MED ORDER — AMLODIPINE BESYLATE 5 MG PO TABS: 5.0000 mg | ORAL_TABLET | Freq: Every day | ORAL | 6 refills | Status: AC

## 2019-03-17 NOTE — Progress Notes (Signed)
Patient: Clinton Bishop Male    DOB: 16-Jul-1990   28 y.o.   MRN: [id number] Visit Date: 03/17/2019  Today's Provider: Wilhemena Durie, MD   Chief Complaint  Patient presents with  . Follow-up  . Hypertension  . ADD   Subjective:     HPI   Essential hypertension From 01/23/2019-Discussed lifestyle,might need to add ARB.Will give him 3-4 months to work on this. Labs checked-showing liver mildly inflamed--stop all alcohol and tylenol and f/u 2 months to recheck.  ADD (attention deficit disorder) From 01/13/2019-RF times 3.he is studying [employer] at [employer] - methylphenidate 36 MG PO CR tablet; Take 1 tablet (36 mg total) by mouth 2 (two) times daily as needed.  Dispense: 60 tablet; Refill: 0  Abnormal LFTs From 01/13/2019-Fatty liver likely. May need workup. Labs checked-showing liver mildly inflamed--stop all alcohol and tylenol and f/u 2 months to recheck.He does not drink alcohol and says he never has.  No Known Allergies   Current Outpatient Medications:  .  methylphenidate (CONCERTA) 36 MG PO CR tablet, Take 1 tablet (36 mg total) by mouth 2 (two) times daily as needed., Disp: 60 tablet, Rfl: 0 .  SUMAtriptan (IMITREX) 50 MG tablet, Take by mouth., Disp: , Rfl:  .  amLODipine (NORVASC) 5 MG tablet, Take 1 tablet (5 mg total) by mouth daily. (Patient not taking: Reported on 03/17/2019), Disp: 30 tablet, Rfl: 6 .  methylphenidate (CONCERTA) 36 MG PO CR tablet, Take 1 tablet (36 mg total) by mouth 2 (two) times daily as needed. (Patient not taking: Reported on 03/17/2019), Disp: 60 tablet, Rfl: 0 .  methylphenidate 36 MG PO CR tablet, Take 1 tablet (36 mg total) by mouth 2 (two) times daily as needed. (Patient not taking: Reported on 03/17/2019), Disp: 60 tablet, Rfl: 0  Review of Systems  Constitutional: Negative for appetite change, chills and fever.  HENT: Negative.   Eyes: Negative.   Respiratory: Negative for chest tightness, shortness of breath and  wheezing.   Cardiovascular: Negative for chest pain and palpitations.  Gastrointestinal: Negative for abdominal pain, nausea and vomiting.    Social History   Tobacco Use  . Smoking status: Never Smoker  . Smokeless tobacco: Never Used  Substance Use Topics  . Alcohol use: No      Objective:   BP (!) 141/84 (BP Location: Right Arm, Patient Position: Sitting, Cuff Size: Large)   Pulse 80   Temp (!) 97.5 F (36.4 C) (Other (Comment))   Resp 16   Ht 5\' 6"  (1.676 m)   Wt 195 lb (88.5 kg)   SpO2 97%   BMI 31.47 kg/m  Vitals:   03/17/19 1137  BP: (!) 141/84  Pulse: 80  Resp: 16  Temp: (!) 97.5 F (36.4 C)  TempSrc: Other (Comment)  SpO2: 97%  Weight: 195 lb (88.5 kg)  Height: 5\' 6"  (1.676 m)  Body mass index is 31.47 kg/m.   Physical Exam Vitals signs reviewed.  Constitutional:      Appearance: He is well-developed. He is obese.  Eyes:     Conjunctiva/sclera: Conjunctivae normal.     Pupils: Pupils are equal, round, and reactive to light.  Neck:     Musculoskeletal: Normal range of motion and neck supple.  Cardiovascular:     Rate and Rhythm: Normal rate and regular rhythm.     Heart sounds: Normal heart sounds.  Pulmonary:     Effort: Pulmonary effort is normal.  Breath sounds: Normal breath sounds.  Abdominal:     Palpations: Abdomen is soft.  Musculoskeletal: Normal range of motion.  Skin:    General: Skin is warm and dry.  Neurological:     General: No focal deficit present.     Mental Status: He is alert and oriented to person, place, and time.     Deep Tendon Reflexes: Reflexes are normal and symmetric.  Psychiatric:        Mood and Affect: Mood normal.        Behavior: Behavior normal.        Thought Content: Thought content normal.        Judgment: Judgment normal.      No results found for any visits on 03/17/19.     Assessment & Plan    1. Need for influenza vaccination  - Flu Vaccine QUAD 36+ mos IM  2. Elevated liver  enzymes Consider cutting back on Concerta. - Comprehensive Metabolic Panel (CMET)  3. ADD (attention deficit disorder) Has been on this dose for more than 12 years. - methylphenidate 36 MG PO CR tablet; Take 1 tablet (36 mg total) by mouth 2 (two) times daily as needed.  Dispense: 60 tablet; Refill: 0  4. Essential hypertension Start Norvasc daily RTC 3-4 months - amLODipine (NORVASC) 5 MG tablet; Take 1 tablet (5 mg total) by mouth daily.  Dispense: 30 tablet; Refill: 6     Acheron Sugg Wendelyn Breslow, MD  [facility]

## 2019-03-17 NOTE — Patient Instructions (Addendum)
1. Essential hypertension  - amLODipine (NORVASC) 5 MG tablet.Take 1 tablet (5 mg total) by mouth daily. Disp 30 Refills 6 - Comprehensive Metabolic Panel  2. ADD (attention deficit disorder) -methylphenidate (CONCERTA) 36 MG PO CR tablet. Take 1 tablet (36 mg total) by mouth 2 (two) times daily as needed. Refills 3  3. Need for influenza vaccination  - Influenza,inj,Quad PF,6+ Mos

## 2019-03-18 LAB — COMPREHENSIVE METABOLIC PANEL
ALT: 155 IU/L — ABNORMAL HIGH (ref 0–44)
AST: 126 IU/L — ABNORMAL HIGH (ref 0–40)
Albumin/Globulin Ratio: 1.8 (ref 1.2–2.2)
Albumin: 4.6 g/dL (ref 4.1–5.2)
Alkaline Phosphatase: 78 IU/L (ref 39–117)
BUN/Creatinine Ratio: 20 (ref 9–20)
BUN: 16 mg/dL (ref 6–20)
Bilirubin Total: 0.5 mg/dL (ref 0.0–1.2)
CO2: 21 mmol/L (ref 20–29)
Calcium: 9.6 mg/dL (ref 8.7–10.2)
Chloride: 104 mmol/L (ref 96–106)
Creatinine, Ser: 0.8 mg/dL (ref 0.76–1.27)
GFR calc Af Amer: 142 mL/min/{1.73_m2} (ref >59)
GFR calc non Af Amer: 122 mL/min/{1.73_m2} (ref >59)
Globulin, Total: 2.6 g/dL (ref 1.5–4.5)
Glucose: 101 mg/dL — ABNORMAL HIGH (ref 65–99)
Potassium: 4.2 mmol/L (ref 3.5–5.2)
Sodium: 138 mmol/L (ref 134–144)
Total Protein: 7.2 g/dL (ref 6.0–8.5)

## 2019-05-06 ENCOUNTER — Ambulatory Visit: Payer: Self-pay | Admitting: Family Medicine

## 2019-05-07 ENCOUNTER — Other Ambulatory Visit: Payer: Self-pay

## 2019-05-07 DIAGNOSIS — Z20822 Contact with and (suspected) exposure to covid-19: Secondary | ICD-10-CM

## 2019-05-08 LAB — NOVEL CORONAVIRUS, NAA: SARS-CoV-2, NAA: NOT DETECTED

## 2019-05-12 ENCOUNTER — Other Ambulatory Visit: Payer: Self-pay

## 2019-05-12 DIAGNOSIS — Z20822 Contact with and (suspected) exposure to covid-19: Secondary | ICD-10-CM

## 2019-05-14 LAB — NOVEL CORONAVIRUS, NAA: SARS-CoV-2, NAA: NOT DETECTED

## 2019-07-17 NOTE — Progress Notes (Deleted)
       Patient: Clinton Bishop Male    DOB: Nov 24, 1990   29 y.o.   MRN: [id number] Visit Date: 07/17/2019  Today's Provider: Megan Mans, MD   No chief complaint on file.  Subjective:     HPI   Elevated liver enzymes From 03/17/2019-advised to consider cutting back on Concerta.  ADD (attention deficit disorder) From 03/17/2019-Has been on this dose for more than 12 years, Concerta 36 mg.  Essential hypertension From 03/17/2019-Started Norvasc 5 mg daily.   No Known Allergies   Current Outpatient Medications:  .  amLODipine (NORVASC) 5 MG tablet, Take 1 tablet (5 mg total) by mouth daily., Disp: 30 tablet, Rfl: 6 .  methylphenidate (CONCERTA) 36 MG PO CR tablet, Take 1 tablet (36 mg total) by mouth 2 (two) times daily as needed., Disp: 60 tablet, Rfl: 0 .  methylphenidate (CONCERTA) 36 MG PO CR tablet, Take 1 tablet (36 mg total) by mouth 2 (two) times daily as needed., Disp: 60 tablet, Rfl: 0 .  methylphenidate 36 MG PO CR tablet, Take 1 tablet (36 mg total) by mouth 2 (two) times daily as needed., Disp: 60 tablet, Rfl: 0 .  SUMAtriptan (IMITREX) 50 MG tablet, Take by mouth., Disp: , Rfl:   Review of Systems  Constitutional: Negative for appetite change, chills and fever.  Respiratory: Negative for chest tightness, shortness of breath and wheezing.   Cardiovascular: Negative for chest pain and palpitations.  Gastrointestinal: Negative for abdominal pain, nausea and vomiting.    Social History   Tobacco Use  . Smoking status: Never Smoker  . Smokeless tobacco: Never Used  Substance Use Topics  . Alcohol use: No      Objective:   There were no vitals taken for this visit. There were no vitals filed for this visit.There is no height or weight on file to calculate BMI.   Physical Exam   No results found for any visits on 07/21/19.     Assessment & Plan        Megan Mans, MD  [facility]

## 2019-07-21 ENCOUNTER — Ambulatory Visit: Payer: Self-pay | Admitting: Family Medicine

## 2019-07-28 ENCOUNTER — Ambulatory Visit (INDEPENDENT_AMBULATORY_CARE_PROVIDER_SITE_OTHER): Payer: BC Managed Care – PPO | Admitting: Family Medicine

## 2019-07-28 ENCOUNTER — Other Ambulatory Visit: Payer: Self-pay

## 2019-07-28 ENCOUNTER — Encounter: Payer: Self-pay | Admitting: Family Medicine

## 2019-07-28 VITALS — BP 123/84 | HR 89 | Temp 96.8°F | Wt 192.4 lb

## 2019-07-28 DIAGNOSIS — E781 Pure hyperglyceridemia: Secondary | ICD-10-CM | POA: Diagnosis not present

## 2019-07-28 DIAGNOSIS — I10 Essential (primary) hypertension: Secondary | ICD-10-CM

## 2019-07-28 DIAGNOSIS — K760 Fatty (change of) liver, not elsewhere classified: Secondary | ICD-10-CM

## 2019-07-28 DIAGNOSIS — R748 Abnormal levels of other serum enzymes: Secondary | ICD-10-CM

## 2019-07-28 DIAGNOSIS — F988 Other specified behavioral and emotional disorders with onset usually occurring in childhood and adolescence: Secondary | ICD-10-CM

## 2019-07-28 NOTE — Progress Notes (Signed)
Patient: Clinton Bishop Male    DOB: [birth year]   29 y.o.   MRN: [id number] Visit Date: 07/28/2019  Today's Provider: Megan Mans, MD   Chief Complaint  Patient presents with  . Hypertension  . ADD   Subjective:     HPI  Patient is doing well.  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  He is engaged to be married and will marry his fiance on July 23 of this year.  He drinks very little and is started working on his exercise. Chart is reviewed for fatty liver, patient had complete GI work-up including liver biopsy in 2016. Elevated liver enzymes From 03/17/2019-Consider cutting back on Concerta. Labs checked showing-Labs stable. Liver function still mildly elevated but essentially the same. Advised to work hard on diet and exercise for this. We will recheck at the first of the year.  ADD (attention deficit disorder) From 03/17/2019-Has been on this dose for more than 12 years.  Essential hypertension From 03/17/2019-Started Norvasc daily RTC 3-4 months.  No Known Allergies   Current Outpatient Medications:  .  amLODipine (NORVASC) 5 MG tablet, Take 1 tablet (5 mg total) by mouth daily., Disp: 30 tablet, Rfl: 6 .  methylphenidate (CONCERTA) 36 MG PO CR tablet, Take 1 tablet (36 mg total) by mouth 2 (two) times daily as needed., Disp: 60 tablet, Rfl: 0 .  methylphenidate (CONCERTA) 36 MG PO CR tablet, Take 1 tablet (36 mg total) by mouth 2 (two) times daily as needed., Disp: 60 tablet, Rfl: 0 .  methylphenidate 36 MG PO CR tablet, Take 1 tablet (36 mg total) by mouth 2 (two) times daily as needed., Disp: 60 tablet, Rfl: 0 .  SUMAtriptan (IMITREX) 50 MG tablet, Take by mouth., Disp: , Rfl:   Review of Systems  Constitutional: Negative for appetite change, chills and fever.  Eyes: Negative.   Respiratory: Negative for chest tightness, shortness of breath  and wheezing.   Cardiovascular: Negative for chest pain and palpitations.  Gastrointestinal: Negative for abdominal pain, nausea and vomiting.  Endocrine: Negative.   Genitourinary: Negative.   Allergic/Immunologic: Negative.   Neurological: Negative.   Psychiatric/Behavioral: Negative.     Social History   Tobacco Use  . Smoking status: Never Smoker  . Smokeless tobacco: Never Used  Substance Use Topics  . Alcohol use: No      Objective:   There were no vitals taken for this visit. There were no vitals filed for this visit.There is no height or weight on file to calculate BMI.   Physical Exam Vitals reviewed.  Constitutional:      Appearance: He is well-developed.  Eyes:     Conjunctiva/sclera: Conjunctivae normal.     Pupils: Pupils are equal, round, and reactive to light.  Cardiovascular:     Rate and Rhythm: Normal rate and regular rhythm.     Heart sounds: Normal heart sounds.  Pulmonary:     Effort: Pulmonary effort is normal.     Breath sounds: Normal breath sounds.  Abdominal:     Palpations: Abdomen is soft.  Musculoskeletal:        General: Normal range of motion.     Cervical back: Normal range of motion and neck supple.  Skin:    General: Skin is warm and dry.  Neurological:     General: No focal deficit present.  Mental Status: He is alert and oriented to person, place, and time.     Deep Tendon Reflexes: Reflexes are normal and symmetric.  Psychiatric:        Mood and Affect: Mood normal.        Behavior: Behavior normal.        Thought Content: Thought content normal.        Judgment: Judgment normal.      No results found for any visits on 07/28/19.     Assessment & Plan    1. Abnormal liver enzymes Patient to continue to work on diet and exercise with weight loss.  He probably needs to lose about 15 to 20 pounds.  He has started exercising and feels well. - Comprehensive metabolic panel  2. Fatty liver Followed routinely at times  and lab work.  3. Essential hypertension Controlled on amlodipine. - Comprehensive metabolic panel  4. Pure hypertriglyceridemia Follow lipids and treat appropriately.  5. Attention deficit disorder, unspecified hyperactivity presence On Concerta.  Will attempt to decrease dose over time     Wilhemena Durie, MD  [place]

## 2019-07-29 ENCOUNTER — Encounter: Payer: Self-pay | Admitting: Family Medicine

## 2019-07-29 LAB — COMPREHENSIVE METABOLIC PANEL
ALT: 105 IU/L — ABNORMAL HIGH (ref 0–44)
AST: 68 IU/L — ABNORMAL HIGH (ref 0–40)
Albumin/Globulin Ratio: 1.8 (ref 1.2–2.2)
Albumin: 4.4 g/dL (ref 4.1–5.2)
Alkaline Phosphatase: 89 IU/L (ref 39–117)
BUN/Creatinine Ratio: 24 — ABNORMAL HIGH (ref 9–20)
BUN: 22 mg/dL — ABNORMAL HIGH (ref 6–20)
Bilirubin Total: 0.5 mg/dL (ref 0.0–1.2)
CO2: 19 mmol/L — ABNORMAL LOW (ref 20–29)
Calcium: 9.7 mg/dL (ref 8.7–10.2)
Chloride: 100 mmol/L (ref 96–106)
Creatinine, Ser: 0.92 mg/dL (ref 0.76–1.27)
GFR calc Af Amer: 131 mL/min/{1.73_m2} (ref >59)
GFR calc non Af Amer: 114 mL/min/{1.73_m2} (ref >59)
Globulin, Total: 2.5 g/dL (ref 1.5–4.5)
Glucose: 91 mg/dL (ref 65–99)
Potassium: 4 mmol/L (ref 3.5–5.2)
Sodium: 138 mmol/L (ref 134–144)
Total Protein: 6.9 g/dL (ref 6.0–8.5)

## 2019-07-31 ENCOUNTER — Telehealth: Payer: Self-pay

## 2019-07-31 NOTE — Telephone Encounter (Signed)
Patient advised.

## 2019-07-31 NOTE — Telephone Encounter (Signed)
-----   Message from Maple Hudson., MD sent at 07/31/2019  9:08 AM EST ----- Labs improving, continue to work on diet exercise and weight loss.

## 2020-01-28 ENCOUNTER — Ambulatory Visit: Payer: Self-pay | Admitting: Family Medicine

## 2020-08-04 NOTE — Progress Notes (Signed)
I,April Miller,acting as a scribe for Megan Mans, MD.,have documented all relevant documentation on the behalf of Megan Mans, MD,as directed by  Megan Mans, MD while in the presence of Megan Mans, MD.   Established patient visit   Patient: Clinton Bishop   DOB: 1990-10-05   30 y.o. Male  MRN: [id number] Visit Date: 08/05/2020  Today's healthcare provider: Megan Mans, MD   Chief Complaint  Patient presents with  . Follow-up  . Hypertension   Subjective    HPI  30 year old married white male comes in today for follow-up.  No children.  He is currently in his last semester at [employer] and wishes to restart his Concerta to get through the semester. He changed insurances last year with his wife's job and went to wait for a physical and was found to have consistently elevated liver function tests consistent with his diagnosis of fatty liver. He has not been exercising lately.  He feels well.  He stopped his amlodipine a year ago and again he is fallen off the exercise routine. No complaints today.  He does not drink alcohol. Hypertension, follow-up  BP Readings from Last 3 Encounters:  08/05/20 (!) 131/91  07/28/19 123/84  03/17/19 (!) 141/84   Wt Readings from Last 3 Encounters:  08/05/20 195 lb (88.5 kg)  07/28/19 192 lb 6.4 oz (87.3 kg)  03/17/19 195 lb (88.5 kg)     He was last seen for hypertension 1 years ago.  BP at that visit was 123/84. Management since that visit includes; Controlled on amlodipine. He reports good compliance with treatment. He is not having side effects. none He is not exercising. He is not adherent to low salt diet.   Outside blood pressures are not checking.  He does not smoke.  Use of agents associated with hypertension: none.   -------------------------------------------------------------------- Abnormal liver enzymes From 07/28/2019-Patient to continue to work on diet and exercise with weight  loss.  He probably needs to lose about 15 to 20 pounds.  He has started exercising and feels well.  Fatty liver From 07/28/2019-Followed routinely at times and lab work.  Attention deficit disorder, unspecified hyperactivity presence From 07/28/2019-On Concerta.  Will attempt to decrease dose over time.       Medications: Outpatient Medications Prior to Visit  Medication Sig  . methylphenidate (CONCERTA) 36 MG PO CR tablet Take 1 tablet (36 mg total) by mouth 2 (two) times daily as needed.  . methylphenidate (CONCERTA) 36 MG PO CR tablet Take 1 tablet (36 mg total) by mouth 2 (two) times daily as needed.  . methylphenidate 36 MG PO CR tablet Take 1 tablet (36 mg total) by mouth 2 (two) times daily as needed.  . SUMAtriptan (IMITREX) 50 MG tablet Take by mouth.  Marland Kitchen amLODipine (NORVASC) 5 MG tablet Take 1 tablet (5 mg total) by mouth daily. (Patient not taking: Reported on 08/05/2020)   No facility-administered medications prior to visit.    Review of Systems      Objective    BP (!) 131/91 (BP Location: Right Arm, Patient Position: Sitting, Cuff Size: Large)   Pulse 81   Temp 98.3 F (36.8 C) (Oral)   Resp 16   Ht 5\' 6"  (1.676 m)   Wt 195 lb (88.5 kg)   SpO2 95%   BMI 31.47 kg/m  BP Readings from Last 3 Encounters:  08/05/20 (!) 131/91  07/28/19 123/84  03/17/19 (!) 141/84   Wt  Readings from Last 3 Encounters:  08/05/20 195 lb (88.5 kg)  07/28/19 192 lb 6.4 oz (87.3 kg)  03/17/19 195 lb (88.5 kg)       Physical Exam Vitals reviewed.  Constitutional:      Appearance: He is well-developed.  Eyes:     Conjunctiva/sclera: Conjunctivae normal.     Pupils: Pupils are equal, round, and reactive to light.  Cardiovascular:     Rate and Rhythm: Normal rate and regular rhythm.     Heart sounds: Normal heart sounds.  Pulmonary:     Effort: Pulmonary effort is normal.     Breath sounds: Normal breath sounds.  Abdominal:     Palpations: Abdomen is soft.   Musculoskeletal:     Cervical back: Normal range of motion and neck supple.  Skin:    General: Skin is warm and dry.  Neurological:     General: No focal deficit present.     Mental Status: He is alert and oriented to person, place, and time.     Deep Tendon Reflexes: Reflexes are normal and symmetric.  Psychiatric:        Mood and Affect: Mood normal.        Behavior: Behavior normal.        Thought Content: Thought content normal.        Judgment: Judgment normal.       No results found for any visits on 08/05/20.  Assessment & Plan     1. ADD (attention deficit disorder) Refill Concerta but will use half the dose he was using before.  I think he was using too much.  He is comfortable with the lower dose - methylphenidate 36 MG PO CR tablet; Take 1 tablet (36 mg total) by mouth daily.  Dispense: 30 tablet; Refill: 0  2. Essential hypertension Follow blood pressure and work on exercise.  He is on no medications presently. - Comprehensive metabolic panel  3. Fatty liver Consistent elevations of fatty liver at time of physical last fall.  Repeat liver functions today and I will see him back this summer to recheck blood pressure.   No follow-ups on file.      I, Megan Mans, MD, have reviewed all documentation for this visit. The documentation on 08/05/20 for the exam, diagnosis, procedures, and orders are all accurate and complete.    Janai Brannigan Wendelyn Breslow, MD  [facility] [phone] (phone) [phone] (fax)  [facility]

## 2020-08-05 ENCOUNTER — Encounter: Payer: Self-pay | Admitting: Family Medicine

## 2020-08-05 ENCOUNTER — Ambulatory Visit (INDEPENDENT_AMBULATORY_CARE_PROVIDER_SITE_OTHER): Payer: BC Managed Care – PPO | Admitting: Family Medicine

## 2020-08-05 ENCOUNTER — Other Ambulatory Visit: Payer: Self-pay

## 2020-08-05 VITALS — BP 131/91 | HR 81 | Temp 98.3°F | Resp 16 | Ht 66.0 in | Wt 195.0 lb

## 2020-08-05 DIAGNOSIS — K760 Fatty (change of) liver, not elsewhere classified: Secondary | ICD-10-CM | POA: Diagnosis not present

## 2020-08-05 DIAGNOSIS — I10 Essential (primary) hypertension: Secondary | ICD-10-CM | POA: Diagnosis not present

## 2020-08-05 DIAGNOSIS — F988 Other specified behavioral and emotional disorders with onset usually occurring in childhood and adolescence: Secondary | ICD-10-CM | POA: Diagnosis not present

## 2020-08-05 MED ORDER — METHYLPHENIDATE HCL ER (OSM) 36 MG PO TBCR: 36.0000 mg | EXTENDED_RELEASE_TABLET | Freq: Every day | ORAL | 0 refills | Status: AC

## 2020-08-06 LAB — COMPREHENSIVE METABOLIC PANEL
ALT: 120 IU/L — ABNORMAL HIGH (ref 0–44)
AST: 71 IU/L — ABNORMAL HIGH (ref 0–40)
Albumin/Globulin Ratio: 1.7 (ref 1.2–2.2)
Albumin: 4.8 g/dL (ref 4.1–5.2)
Alkaline Phosphatase: 88 IU/L (ref 44–121)
BUN/Creatinine Ratio: 24 — ABNORMAL HIGH (ref 9–20)
BUN: 19 mg/dL (ref 6–20)
Bilirubin Total: 0.4 mg/dL (ref 0.0–1.2)
CO2: 21 mmol/L (ref 20–29)
Calcium: 9.9 mg/dL (ref 8.7–10.2)
Chloride: 99 mmol/L (ref 96–106)
Creatinine, Ser: 0.79 mg/dL (ref 0.76–1.27)
Globulin, Total: 2.8 g/dL (ref 1.5–4.5)
Glucose: 111 mg/dL — ABNORMAL HIGH (ref 65–99)
Potassium: 4.7 mmol/L (ref 3.5–5.2)
Sodium: 137 mmol/L (ref 134–144)
Total Protein: 7.6 g/dL (ref 6.0–8.5)
eGFR: 124 mL/min/{1.73_m2} (ref >59)

## 2020-10-07 ENCOUNTER — Encounter: Payer: Self-pay | Admitting: Family Medicine

## 2020-12-14 ENCOUNTER — Ambulatory Visit: Payer: Self-pay | Admitting: Family Medicine

## 2020-12-14 NOTE — Progress Notes (Deleted)
      Established patient visit   Patient: Clinton Bishop   DOB: Jun 10, 1990   30 y.o. Male  MRN: [id number] Visit Date: 12/14/2020  Today's healthcare provider: Megan Mans, MD   No chief complaint on file.  Subjective    HPI  Follow up for ADD (attention deficit disorder)  The patient was last seen for this 4 months ago. Changes made at last visit include; decreased methylphenidate to 36 MG qd.  He reports {excellent/good/fair/poor:19665} compliance with treatment. He feels that condition is {improved/worse/unchanged:3041574}. He {is/is not:21021397} having side effects. ***  ----------------------------------------------------------------------------------------- Hypertension, follow-up  BP Readings from Last 3 Encounters:  08/05/20 (!) 131/91  07/28/19 123/84  03/17/19 (!) 141/84   Wt Readings from Last 3 Encounters:  08/05/20 195 lb (88.5 kg)  07/28/19 192 lb 6.4 oz (87.3 kg)  03/17/19 195 lb (88.5 kg)     He was last seen for hypertension 4 months ago.  BP at that visit was 131/91. Management since that visit includes; Follow blood pressure and work on exercise.  He is on no medications presently. He reports {excellent/good/fair/poor:19665} compliance with treatment. He {is/is not:9024} having side effects. {document side effects if present:1} He {is/is not:9024} exercising. He {is/is not:9024} adherent to low salt diet.   Outside blood pressures are {enter patient reported home BP, or 'not being checked':1}.  He {does/does not:200015} smoke.  Use of agents associated with hypertension: {bp agents assoc with hypertension:511}.   --------------------------------------------------------------------------------------------------- Follow up for Fatty liver  The patient was last seen for this 4 months ago. Changes made at last visit include; labs checked showing-patient advised to work hard on diet and exercise due to continued fatty liver  changes.  He reports {excellent/good/fair/poor:19665} compliance with treatment. He feels that condition is {improved/worse/unchanged:3041574}. He {is/is not:21021397} having side effects. ***  -----------------------------------------------------------------------------------------   {Show patient history (optional):23778}   Medications: Outpatient Medications Prior to Visit  Medication Sig   amLODipine (NORVASC) 5 MG tablet Take 1 tablet (5 mg total) by mouth daily. (Patient not taking: Reported on 08/05/2020)   methylphenidate (CONCERTA) 36 MG PO CR tablet Take 1 tablet (36 mg total) by mouth daily.   methylphenidate (CONCERTA) 36 MG PO CR tablet Take 1 tablet (36 mg total) by mouth daily.   methylphenidate 36 MG PO CR tablet Take 1 tablet (36 mg total) by mouth daily.   SUMAtriptan (IMITREX) 50 MG tablet Take by mouth.   No facility-administered medications prior to visit.    Review of Systems  Constitutional:  Negative for appetite change, chills and fever.  Respiratory:  Negative for chest tightness, shortness of breath and wheezing.   Cardiovascular:  Negative for chest pain and palpitations.  Gastrointestinal:  Negative for abdominal pain, nausea and vomiting.   {Labs  Heme  Chem  Endocrine  Serology  Results Review (optional):23779}   Objective    There were no vitals taken for this visit. {Show previous vital signs (optional):23777}   Physical Exam  ***  No results found for any visits on 12/14/20.  Assessment & Plan     ***  No follow-ups on file.      {provider attestation***:1}   Megan Mans, MD  [facility] [phone] (phone) [phone] (fax)  [facility]

## 2021-10-14 ENCOUNTER — Encounter: Payer: Self-pay | Admitting: Family Medicine

## 2021-10-14 ENCOUNTER — Ambulatory Visit: Payer: Self-pay | Admitting: Family Medicine

## 2021-10-14 VITALS — BP 136/87 | HR 75 | Temp 97.8°F | Resp 16 | Wt 173.9 lb

## 2021-10-14 DIAGNOSIS — M25461 Effusion, right knee: Secondary | ICD-10-CM

## 2021-10-14 MED ORDER — MELOXICAM 15 MG PO TABS: 15.0000 mg | ORAL_TABLET | Freq: Every day | ORAL | 0 refills | Status: AC

## 2021-10-14 MED ORDER — CYCLOBENZAPRINE HCL 10 MG PO TABS: 10.0000 mg | ORAL_TABLET | Freq: Every day | ORAL | 0 refills | Status: AC

## 2021-10-14 MED ORDER — PREDNISONE 10 MG (21) PO TBPK: ORAL_TABLET | ORAL | 0 refills | Status: AC

## 2021-10-14 NOTE — Assessment & Plan Note (Signed)
Acute pain, denies known cause of injury Encouraged use of voltaren with high dose anti-inflammatory medication and muscle relaxants at night  Steroid taper to start tomorrow to assist with acute inflammation Recommend use of ice, rest and elevation Encourage proper support via shoes to assist given chronic standing at employment Follow up as needed Will refer to sports medicine if desired

## 2021-10-14 NOTE — Progress Notes (Signed)
I,[name],acting as a Education administrator for Clinton Sprout, FNP.,have documented all relevant documentation on the behalf of Clinton Sprout, FNP,as directed by  Clinton Sprout, FNP while in the presence of Clinton Sprout, FNP.   Established patient visit   Patient: Clinton Bishop   DOB: May 23, 1991   31 y.o. Male  MRN: [id number] Visit Date: 10/14/2021  Today's healthcare provider: [name], FNP  Patient presents for new patient visit to establish care.  Introduced to Designer, jewellery role and practice setting.  All questions answered.  Discussed provider/patient relationship and expectations.   Chief Complaint  Patient presents with   Knee Pain  Patient presents for right knee pain.  Subjective     Patient reports right knee pain, swelling, warm to the touch.  No known injury.  Onset 2 days ago. Better with movement. Worse at night.  Pain scale 7-8 last night.  Taking Ibuprofen since onset.  Reports doing wall squats and doing some lifting recently but otherwise unknown cause.   Medications: Outpatient Medications Prior to Visit  Medication Sig   amLODipine (NORVASC) 5 MG tablet Take 1 tablet (5 mg total) by mouth daily. (Patient not taking: Reported on 08/05/2020)   methylphenidate (CONCERTA) 36 MG PO CR tablet Take 1 tablet (36 mg total) by mouth daily. (Patient not taking: Reported on 10/14/2021)   methylphenidate (CONCERTA) 36 MG PO CR tablet Take 1 tablet (36 mg total) by mouth daily. (Patient not taking: Reported on 10/14/2021)   methylphenidate 36 MG PO CR tablet Take 1 tablet (36 mg total) by mouth daily. (Patient not taking: Reported on 10/14/2021)   SUMAtriptan (IMITREX) 50 MG tablet Take by mouth. (Patient not taking: Reported on 10/14/2021)   No facility-administered medications prior to visit.    Review of Systems     Objective    BP 136/87 (BP Location: Right Arm, Patient Position: Sitting, Cuff Size: Normal)   Pulse 75   Temp 97.8 F (36.6 C) (Oral)   Resp  16   Wt 173 lb 14.4 oz (78.9 kg)   SpO2 97%   BMI 28.07 kg/m    Physical Exam Vitals and nursing note reviewed.  Constitutional:      Appearance: Normal appearance. He is obese.  HENT:     Head: Normocephalic and atraumatic.  Eyes:     Pupils: Pupils are equal, round, and reactive to light.  Cardiovascular:     Rate and Rhythm: Normal rate and regular rhythm.     Pulses: Normal pulses.     Heart sounds: Normal heart sounds.  Pulmonary:     Effort: Pulmonary effort is normal.     Breath sounds: Normal breath sounds.  Musculoskeletal:        General: Swelling and tenderness present.     Cervical back: Normal range of motion.     Right knee: Swelling and erythema present. Decreased range of motion. Tenderness present.       Legs:  Skin:    General: Skin is warm and dry.     Capillary Refill: Capillary refill takes less than 2 seconds.  Neurological:     General: No focal deficit present.     Mental Status: He is alert and oriented to person, place, and time. Mental status is at baseline.  Psychiatric:        Mood and Affect: Mood normal.        Behavior: Behavior normal.  Thought Content: Thought content normal.        Judgment: Judgment normal.      No results found for any visits on 10/14/21.  Assessment & Plan     Problem List Items Addressed This Visit       Musculoskeletal and Integument   Effusion of bursa of right knee - Primary    Acute pain, denies known cause of injury Encouraged use of voltaren with high dose anti-inflammatory medication and muscle relaxants at night  Steroid taper to start tomorrow to assist with acute inflammation Recommend use of ice, rest and elevation Encourage proper support via shoes to assist given chronic standing at employment Follow up as needed Will refer to sports medicine if desired        Relevant Medications   meloxicam (MOBIC) 15 MG tablet   predniSONE (STERAPRED UNI-PAK 21 TAB) 10 MG (21) TBPK tablet    cyclobenzaprine (FLEXERIL) 10 MG tablet     Return if symptoms worsen or fail to improve.      Vonna Kotyk, FNP, have reviewed all documentation for this visit. The documentation on 10/14/21 for the exam, diagnosis, procedures, and orders are all accurate and complete.    Clinton Bishop, [place] [phone] (phone) [phone] (fax)  [place]

## 2021-10-16 ENCOUNTER — Encounter: Payer: Self-pay | Admitting: Family Medicine

## 2022-05-26 ENCOUNTER — Encounter: Payer: Self-pay | Admitting: Family Medicine

## 2022-05-31 ENCOUNTER — Ambulatory Visit: Payer: Self-pay | Admitting: Physician Assistant
